# Patient Record
Sex: Male | Born: 1960 | Race: Black or African American | Hispanic: No | Marital: Married | Smoking: Former smoker
Health system: Southern US, Community
[De-identification: ages and names within clinical notes are randomized; demographics above are authoritative.]

## PROBLEM LIST (undated history)

## (undated) DIAGNOSIS — Z21 Asymptomatic human immunodeficiency virus [HIV] infection status: Secondary | ICD-10-CM

## (undated) DIAGNOSIS — B20 Human immunodeficiency virus [HIV] disease: Secondary | ICD-10-CM

## (undated) DIAGNOSIS — I10 Essential (primary) hypertension: Secondary | ICD-10-CM

---

## 2015-04-12 ENCOUNTER — Encounter (HOSPITAL_COMMUNITY): Payer: Self-pay | Admitting: Emergency Medicine

## 2015-04-12 ENCOUNTER — Inpatient Hospital Stay (HOSPITAL_COMMUNITY)
Admission: EM | Admit: 2015-04-12 | Discharge: 2015-04-14 | DRG: 682 | Disposition: A | Discharge status: Home / Self Care | Payer: Medicaid Other | Attending: Family Medicine | Admitting: Family Medicine

## 2015-04-12 ENCOUNTER — Emergency Department (HOSPITAL_COMMUNITY): Payer: Medicaid Other

## 2015-04-12 DIAGNOSIS — R918 Other nonspecific abnormal finding of lung field: Secondary | ICD-10-CM | POA: Diagnosis present

## 2015-04-12 DIAGNOSIS — Z8249 Family history of ischemic heart disease and other diseases of the circulatory system: Secondary | ICD-10-CM | POA: Diagnosis not present

## 2015-04-12 DIAGNOSIS — E876 Hypokalemia: Secondary | ICD-10-CM | POA: Diagnosis present

## 2015-04-12 DIAGNOSIS — Z9119 Patient's noncompliance with other medical treatment and regimen: Secondary | ICD-10-CM | POA: Diagnosis present

## 2015-04-12 DIAGNOSIS — Z21 Asymptomatic human immunodeficiency virus [HIV] infection status: Secondary | ICD-10-CM | POA: Diagnosis not present

## 2015-04-12 DIAGNOSIS — A084 Viral intestinal infection, unspecified: Secondary | ICD-10-CM | POA: Diagnosis present

## 2015-04-12 DIAGNOSIS — E860 Dehydration: Secondary | ICD-10-CM | POA: Diagnosis not present

## 2015-04-12 DIAGNOSIS — B20 Human immunodeficiency virus [HIV] disease: Secondary | ICD-10-CM | POA: Diagnosis present

## 2015-04-12 DIAGNOSIS — N179 Acute kidney failure, unspecified: Principal | ICD-10-CM | POA: Diagnosis present

## 2015-04-12 DIAGNOSIS — I10 Essential (primary) hypertension: Secondary | ICD-10-CM | POA: Diagnosis present

## 2015-04-12 DIAGNOSIS — R1110 Vomiting, unspecified: Secondary | ICD-10-CM

## 2015-04-12 DIAGNOSIS — Z7189 Other specified counseling: Secondary | ICD-10-CM | POA: Diagnosis not present

## 2015-04-12 DIAGNOSIS — R197 Diarrhea, unspecified: Secondary | ICD-10-CM | POA: Diagnosis present

## 2015-04-12 DIAGNOSIS — Z515 Encounter for palliative care: Secondary | ICD-10-CM | POA: Diagnosis not present

## 2015-04-12 DIAGNOSIS — Z66 Do not resuscitate: Secondary | ICD-10-CM | POA: Diagnosis present

## 2015-04-12 DIAGNOSIS — E43 Unspecified severe protein-calorie malnutrition: Secondary | ICD-10-CM | POA: Diagnosis present

## 2015-04-12 DIAGNOSIS — Z87891 Personal history of nicotine dependence: Secondary | ICD-10-CM

## 2015-04-12 DIAGNOSIS — R112 Nausea with vomiting, unspecified: Secondary | ICD-10-CM

## 2015-04-12 DIAGNOSIS — Z681 Body mass index (BMI) 19 or less, adult: Secondary | ICD-10-CM

## 2015-04-12 HISTORY — DX: Essential (primary) hypertension: I10

## 2015-04-12 HISTORY — DX: Asymptomatic human immunodeficiency virus (hiv) infection status: Z21

## 2015-04-12 HISTORY — DX: Human immunodeficiency virus (HIV) disease: B20

## 2015-04-12 LAB — BASIC METABOLIC PANEL
Anion gap: 5 (ref 5–15)
BUN: 24 mg/dL — ABNORMAL HIGH (ref 6–20)
CO2: 22 mmol/L (ref 22–32)
Calcium: 7.9 mg/dL — ABNORMAL LOW (ref 8.9–10.3)
Chloride: 107 mmol/L (ref 101–111)
Creatinine, Ser: 1.82 mg/dL — ABNORMAL HIGH (ref 0.61–1.24)
GFR, EST AFRICAN AMERICAN: 47 mL/min — AB (ref >60)
GFR, EST NON AFRICAN AMERICAN: 41 mL/min — AB (ref >60)
Glucose, Bld: 96 mg/dL (ref 65–99)
POTASSIUM: 2.5 mmol/L — AB (ref 3.5–5.1)
SODIUM: 134 mmol/L — AB (ref 135–145)

## 2015-04-12 LAB — CBC WITH DIFFERENTIAL/PLATELET
BASOS ABS: 0 10*3/uL (ref 0.0–0.1)
Basophils Relative: 0 % (ref 0–1)
Eosinophils Absolute: 0.2 10*3/uL (ref 0.0–0.7)
Eosinophils Relative: 5 % (ref 0–5)
HEMATOCRIT: 40.9 % (ref 39.0–52.0)
HEMOGLOBIN: 14.4 g/dL (ref 13.0–17.0)
LYMPHS PCT: 15 % (ref 12–46)
Lymphs Abs: 0.7 10*3/uL (ref 0.7–4.0)
MCH: 32.7 pg (ref 26.0–34.0)
MCHC: 35.2 g/dL (ref 30.0–36.0)
MCV: 92.7 fL (ref 78.0–100.0)
Monocytes Absolute: 0.6 10*3/uL (ref 0.1–1.0)
Monocytes Relative: 14 % — ABNORMAL HIGH (ref 3–12)
Neutro Abs: 3.1 10*3/uL (ref 1.7–7.7)
Neutrophils Relative %: 66 % (ref 43–77)
PLATELETS: 158 10*3/uL (ref 150–400)
RBC: 4.41 MIL/uL (ref 4.22–5.81)
RDW: 16.8 % — AB (ref 11.5–15.5)
WBC: 4.6 10*3/uL (ref 4.0–10.5)

## 2015-04-12 LAB — COMPREHENSIVE METABOLIC PANEL
ALT: 43 U/L (ref 17–63)
AST: 22 U/L (ref 15–41)
Albumin: 3.7 g/dL (ref 3.5–5.0)
Alkaline Phosphatase: 78 U/L (ref 38–126)
Anion gap: 8 (ref 5–15)
BUN: 27 mg/dL — AB (ref 6–20)
CALCIUM: 8.2 mg/dL — AB (ref 8.9–10.3)
CO2: 24 mmol/L (ref 22–32)
Chloride: 103 mmol/L (ref 101–111)
Creatinine, Ser: 1.85 mg/dL — ABNORMAL HIGH (ref 0.61–1.24)
GFR, EST AFRICAN AMERICAN: 46 mL/min — AB (ref >60)
GFR, EST NON AFRICAN AMERICAN: 40 mL/min — AB (ref >60)
GLUCOSE: 78 mg/dL (ref 65–99)
Potassium: 2.2 mmol/L — CL (ref 3.5–5.1)
SODIUM: 135 mmol/L (ref 135–145)
Total Bilirubin: 0.6 mg/dL (ref 0.3–1.2)
Total Protein: 8.1 g/dL (ref 6.5–8.1)

## 2015-04-12 LAB — URINALYSIS, ROUTINE W REFLEX MICROSCOPIC
BILIRUBIN URINE: NEGATIVE
Glucose, UA: NEGATIVE mg/dL
KETONES UR: NEGATIVE mg/dL
Leukocytes, UA: NEGATIVE
Nitrite: NEGATIVE
Protein, ur: NEGATIVE mg/dL
Specific Gravity, Urine: 1.01 (ref 1.005–1.030)
Urobilinogen, UA: 0.2 mg/dL (ref 0.0–1.0)
pH: 5.5 (ref 5.0–8.0)

## 2015-04-12 LAB — LIPASE, BLOOD: LIPASE: 16 U/L — AB (ref 22–51)

## 2015-04-12 LAB — MAGNESIUM: MAGNESIUM: 1.6 mg/dL — AB (ref 1.7–2.4)

## 2015-04-12 LAB — URINE MICROSCOPIC-ADD ON

## 2015-04-12 LAB — I-STAT CG4 LACTIC ACID, ED: Lactic Acid, Venous: 1.18 mmol/L (ref 0.5–2.0)

## 2015-04-12 MED ORDER — ONDANSETRON HCL 4 MG/2ML IJ SOLN: 4.0000 mg | Freq: Four times a day (QID) | INTRAMUSCULAR | Status: DC | PRN

## 2015-04-12 MED ORDER — POTASSIUM CHLORIDE IN NACL 40-0.9 MEQ/L-% IV SOLN
INTRAVENOUS | Status: DC
  Administered 2015-04-12: 100 mL/h via INTRAVENOUS

## 2015-04-12 MED ORDER — ONDANSETRON HCL 4 MG/2ML IJ SOLN
4.0000 mg | Freq: Once | INTRAMUSCULAR | Status: AC
  Administered 2015-04-12: 4 mg via INTRAVENOUS
  Filled 2015-04-12: qty 2

## 2015-04-12 MED ORDER — DOLUTEGRAVIR SODIUM 50 MG PO TABS
50.0000 mg | ORAL_TABLET | Freq: Every day | ORAL | Status: DC
  Administered 2015-04-13 – 2015-04-14 (×2): 50 mg via ORAL
  Filled 2015-04-12 (×4): qty 1

## 2015-04-12 MED ORDER — AMLODIPINE BESYLATE 5 MG PO TABS
5.0000 mg | ORAL_TABLET | Freq: Every day | ORAL | Status: DC
  Administered 2015-04-13 – 2015-04-14 (×2): 5 mg via ORAL
  Filled 2015-04-12 (×2): qty 1

## 2015-04-12 MED ORDER — ONDANSETRON HCL 4 MG PO TABS: 4.0000 mg | ORAL_TABLET | Freq: Four times a day (QID) | ORAL | Status: DC | PRN

## 2015-04-12 MED ORDER — MAGNESIUM SULFATE 2 GM/50ML IV SOLN
2.0000 g | Freq: Once | INTRAVENOUS | Status: AC
  Administered 2015-04-13: 2 g via INTRAVENOUS
  Filled 2015-04-12: qty 50

## 2015-04-12 MED ORDER — POTASSIUM CHLORIDE 10 MEQ/100ML IV SOLN
10.0000 meq | INTRAVENOUS | Status: DC
  Administered 2015-04-12 (×4): 10 meq via INTRAVENOUS
  Filled 2015-04-12 (×5): qty 100

## 2015-04-12 MED ORDER — PANTOPRAZOLE SODIUM 40 MG IV SOLR
40.0000 mg | Freq: Once | INTRAVENOUS | Status: AC
  Administered 2015-04-12: 40 mg via INTRAVENOUS
  Filled 2015-04-12: qty 40

## 2015-04-12 MED ORDER — RILPIVIRINE HCL 25 MG PO TABS
25.0000 mg | ORAL_TABLET | Freq: Every day | ORAL | Status: DC
  Administered 2015-04-13 – 2015-04-14 (×2): 25 mg via ORAL
  Filled 2015-04-12 (×4): qty 1

## 2015-04-12 MED ORDER — CIPROFLOXACIN IN D5W 400 MG/200ML IV SOLN
400.0000 mg | Freq: Two times a day (BID) | INTRAVENOUS | Status: DC
  Administered 2015-04-13 – 2015-04-14 (×4): 400 mg via INTRAVENOUS
  Filled 2015-04-12 (×4): qty 200

## 2015-04-12 MED ORDER — SODIUM CHLORIDE 0.9 % IJ SOLN
3.0000 mL | Freq: Two times a day (BID) | INTRAMUSCULAR | Status: DC
  Administered 2015-04-12: 3 mL via INTRAVENOUS

## 2015-04-12 MED ORDER — METRONIDAZOLE IN NACL 5-0.79 MG/ML-% IV SOLN: 500.0000 mg | Freq: Three times a day (TID) | INTRAVENOUS | Status: DC

## 2015-04-12 MED ORDER — SODIUM CHLORIDE 0.9 % IV SOLN
Freq: Once | INTRAVENOUS | Status: AC
  Administered 2015-04-12: 17:00:00 via INTRAVENOUS

## 2015-04-12 MED ORDER — SODIUM CHLORIDE 0.9 % IV BOLUS (SEPSIS)
1000.0000 mL | Freq: Once | INTRAVENOUS | Status: AC
  Administered 2015-04-12: 1000 mL via INTRAVENOUS

## 2015-04-12 MED ORDER — ENSURE ENLIVE PO LIQD
237.0000 mL | Freq: Two times a day (BID) | ORAL | Status: DC
Start: 2015-04-13 — End: 2015-04-13
  Administered 2015-04-13 (×2): 237 mL via ORAL

## 2015-04-12 MED ORDER — DARUNAVIR-COBICISTAT 800-150 MG PO TABS
1.0000 | ORAL_TABLET | Freq: Every day | ORAL | Status: DC
  Administered 2015-04-13 – 2015-04-14 (×2): 1 via ORAL
  Filled 2015-04-12 (×4): qty 1

## 2015-04-12 MED ORDER — METRONIDAZOLE IN NACL 5-0.79 MG/ML-% IV SOLN
500.0000 mg | Freq: Three times a day (TID) | INTRAVENOUS | Status: DC
  Administered 2015-04-13 – 2015-04-14 (×5): 500 mg via INTRAVENOUS
  Filled 2015-04-12 (×5): qty 100

## 2015-04-12 NOTE — H&P (Signed)
PCP:   No primary care provider on file.   Chief Complaint:  diarrhea  HPI: 54 yo male h/o hiv/aids, htn comes in with over 2 weeks of diarrhea.  No fevers.  He has not been taking his hiv meds for over 2 months because it makes him sick.  He has had several AIDS related infectious diseases at [facility].  Records are available and have been reviewed.  He understands he has aids, and that he is dying.  He moved here to live with his sister because he is getting sicker.  Came to the ED today because he is very weak.  No pain anywhere.  Asked to admit for his electrolyte abnormalities and aki.  Review of Systems:  Positive and negative as per HPI otherwise all other systems are negative  Past Medical History: Past Medical History  Diagnosis Date  . HIV (human immunodeficiency virus infection)   . Hypertension    History reviewed. No pertinent past surgical history.  Medications: Prior to Admission medications   Medication Sig Start Date End Date Taking? Authorizing Provider  amLODipine (NORVASC) 5 MG tablet Take 5 mg by mouth daily.   Yes Historical Provider, MD  dolutegravir (TIVICAY) 50 MG tablet Take 50 mg by mouth daily.   Yes Historical Provider, MD  rilpivirine (EDURANT) 25 MG TABS tablet Take 25 mg by mouth daily with breakfast.   Yes Historical Provider, MD  PREZCOBIX 800-150 MG per tablet Take 1 tablet by mouth daily. 04/03/15   Historical Provider, MD    Allergies:  No Known Allergies  Social History:  reports that he has quit smoking. He has never used smokeless tobacco. He reports that he does not drink alcohol or use illicit drugs.  Family History: Family History  Problem Relation Age of Onset  . Cancer Father   . Cancer Sister   . Hypertension Sister   . Cancer Other     Physical Exam: Filed Vitals:   04/12/15 2000 04/12/15 2030 04/12/15 2100 04/12/15 2126  BP: 111/89 121/98 115/86 108/76  Pulse: 72 81  65  Temp:    97.8 F (36.6 C)  TempSrc:    Oral   Resp: Height:     (1.753 m)  Weight:    52.708 kg (116 lb 3.2 oz)  SpO2: 100% 99%  100%   General appearance: alert, cooperative and no distress  Cachectic, temporal wasting Head: Normocephalic, without obvious abnormality, atraumatic Eyes: negative Nose: Nares normal. Septum midline. Mucosa normal. No drainage or sinus tenderness. Neck: no JVD and supple, symmetrical, trachea midline Lungs: clear to auscultation bilaterally Heart: regular rate and rhythm, S1, S2 normal, no murmur, click, rub or gallop Abdomen: soft, non-tender; bowel sounds normal; no masses,  no organomegaly Extremities: extremities normal, atraumatic, no cyanosis or edema Pulses: 2+ and symmetric Skin: Skin color, texture, turgor normal. No rashes or lesions Neurologic: Grossly normal    Labs on Admission:   Recent Labs  04/12/15 1725  NA 135  K 2.2*  CL 103  CO2 24  GLUCOSE 78  BUN 27*  CREATININE 1.85*  CALCIUM 8.2*  MG 1.6*    Recent Labs  04/12/15 1725  AST 22  ALT 43  ALKPHOS 78  BILITOT 0.6  PROT 8.1  ALBUMIN 3.7    Recent Labs  04/12/15 1725  LIPASE 16*    Recent Labs  04/12/15 1725  WBC 4.6  NEUTROABS 3.1  HGB 14.4  HCT 40.9  MCV  92.7  PLT 158   Radiological Exams on Admission: Dg Abd Acute W/chest  04/12/2015   CLINICAL DATA:  Four day history of vomiting. History of HIV disease  EXAM: DG ABDOMEN ACUTE W/ 1V CHEST  COMPARISON:  None.  FINDINGS: PA chest: There is no edema or consolidation. There is a 7 mm opacity in the right upper lobe region which is ill-defined and may represent mild scarring. Heart size and pulmonary vascularity are normal. No adenopathy.  Supine and upright abdomen: There is no appreciable bowel dilatation. However, there are multiple air-fluid levels noted. No free air. There are no abnormal calcifications.  IMPRESSION: Scattered air-fluid levels without appreciable bowel dilatation. Suspect early ileus or enteritis as most likely.  Obstruction felt to be less likely. No free air.  No lung edema or consolidation. 7 mm ill-defined opacity right upper lobe of uncertain etiology. Advise followup chest radiograph in 6 to 8 weeks to further evaluate. If this opacity persists, noncontrast enhanced chest CT may be advisable to further assess.   Electronically Signed   By: Bretta BangWilliam  Woodruff III M.D.   On: 04/12/2015 17:48   ekg reviewed u waves present, nsr cxr reviewed no infiltrate or edema Case discussed with edp dr Fayrene Fearingjames  Assessment/Plan  54 yo male with AIDS comes in with diarrhea, aki and electrolyte abdormalites and generalized weakness  Principal Problem:   AKI (acute kidney injury)-  Dehydrated.  2 liters ivf ordered.  Repeat bmp later tonight.  Active Problems:   HIV (human immunodeficiency virus infection)-  Noted, ck cd4 count, off meds for over 2 months.  Does not take any of his prophylactic meds either.   Hypertension- cont norvasc   Hypokalemia-  Replete, repeat bmp later tonight,  Repeat ekg in am, has u waves present.   Diarrhea-  Obtain stool cultures.  Cover with iv cipro and flagyl   Dehydration-  ivf   Nausea & vomiting- only occurred once, none in several hours.  Prn zofran   Lung mass-  Noted   Hypomagnesemia- replete with 2 gm mag sulfate iv once   AIDS-  noted  Noncompliant.  Pt is DNR, does not wish to have cpr or intubation ever in the future.  Has not been set up with ID locally.  Admit to tele floor.    Dyllen Menning A 04/12/2015, 9:45 PM

## 2015-04-12 NOTE — ED Notes (Signed)
Pt reports increasing weakness with emesis x 4 days. Pt has AIDS.

## 2015-04-12 NOTE — ED Notes (Signed)
Pt made aware a urine specimen was needed. Pt verbalized understanding. 

## 2015-04-12 NOTE — ED Notes (Signed)
MD James at bedside updating patient and family. 

## 2015-04-12 NOTE — ED Provider Notes (Signed)
CSN: [id number]     Arrival date & time 04/12/15  1626 History   First MD Initiated Contact with Patient 04/12/15 1635     Chief Complaint  Patient presents with  . Emesis  . Weakness      HPI  Patient presents for evaluation with family. Patient is HIV positive.  6/16:  CD4 count undetectable. Viral load 38,000. This was during an ER visit at [facility]. Also lead diagnosed with candida esophagitis. 11?2015:  [facility] admitted at [facility]. Positive serum Cryptococcal antibodies. However negative spinal tap. Completed fluconazole 2 weeks. Also disseminated zoster. Finished valacyclovir. During that admission, patient's viral load was described as "nearly suppressed". Was positive for normal virus. Negative for C. Difficile.  Patient moved from [place] where he had separated from his wife. He moved to [place] lives with mother, and sister. He's had diarrhea for the last 2 weeks. Was off of his ART for approximately 2-3 weeks before reinitiating one week ago the insistence of his family.  Weakness to the point of "almost collapsing" at home today. Continue diarrhea multiple times per day. Incontinent of stool on the way to the bathroom per family.  Past Medical History  Diagnosis Date  . HIV (human immunodeficiency virus infection)   . Hypertension    History reviewed. No pertinent past surgical history. Family History  Problem Relation Age of Onset  . Cancer Father   . Cancer Sister   . Hypertension Sister   . Cancer Other    History  Substance Use Topics  . Smoking status: Former Games developer  . Smokeless tobacco: Never Used  . Alcohol Use: No    Review of Systems  Constitutional: Negative for fever, chills, diaphoresis, appetite change and fatigue.  HENT: Positive for mouth sores and sore throat. Negative for trouble swallowing.   Eyes: Negative for visual disturbance.  Respiratory: Negative for cough, chest tightness, shortness of breath  and wheezing.   Cardiovascular: Negative for chest pain.  Gastrointestinal: Positive for nausea, vomiting, abdominal pain and diarrhea. Negative for abdominal distention.  Endocrine: Negative for polydipsia, polyphagia and polyuria.  Genitourinary: Negative for dysuria, frequency and hematuria.  Musculoskeletal: Negative for gait problem.  Skin: Negative for color change, pallor and rash.  Neurological: Positive for weakness. Negative for dizziness, syncope, light-headedness and headaches.  Hematological: Does not bruise/bleed easily.  Psychiatric/Behavioral: Negative for behavioral problems and confusion.      Allergies  Review of patient's allergies indicates no known allergies.  Home Medications   Prior to Admission medications   Medication Sig Start Date End Date Taking? Authorizing Provider  amLODipine (NORVASC) 5 MG tablet Take 5 mg by mouth daily.   Yes Historical Provider, MD  dolutegravir (TIVICAY) 50 MG tablet Take 50 mg by mouth daily.   Yes Historical Provider, MD  rilpivirine (EDURANT) 25 MG TABS tablet Take 25 mg by mouth daily with breakfast.   Yes Historical Provider, MD  PREZCOBIX 800-150 MG per tablet Take 1 tablet by mouth daily. 04/03/15   Historical Provider, MD   BP 108/76 mmHg  Pulse 65  Temp(Src) 97.8 F (36.6 C) (Oral)  Resp 15  Ht 5\' 9"  (1.753 m)  Wt 116 lb 3.2 oz (52.708 kg)  BMI 17.15 kg/m2  SpO2 100% Physical Exam  Constitutional: He is oriented to person, place, and time. He has a sickly appearance. No distress.  n Diffuse muscle wasting. Emaciated.  HENT:  Head: Normocephalic.  Toprol muscle wasting. Sunken eyes. Conjunctiva not pale.  Mucous membranes of the mouth are slightly dry.  Eyes: Conjunctivae are normal. Pupils are equal, round, and reactive to light. No scleral icterus.  Neck: Normal range of motion. Neck supple. No thyromegaly present.  No JVD  Cardiovascular: Normal rate and regular rhythm.  Exam reveals no gallop and no friction  rub.   No murmur heard. Normal cardiac exam. Not tachycardic. BP 110 systolic.  Pulmonary/Chest: Effort normal and breath sounds normal. No respiratory distress. He has no wheezes. He has no rales.  Clear bilateral breath sounds.  Abdominal: Soft. Bowel sounds are normal. He exhibits no distension. There is no tenderness. There is no rebound.  Soft benign abdomen. Normal active bowel sounds.  Musculoskeletal: Normal range of motion.  Neurological: He is alert and oriented to person, place, and time.  Skin: Skin is warm and dry. No rash noted.  Dry skin. No rash.  Psychiatric: He has a normal mood and affect. His behavior is normal.    ED Course  Procedures (including critical care time) Labs Review Labs Reviewed  CBC WITH DIFFERENTIAL/PLATELET - Abnormal; Notable for the following:    RDW 16.8 (*)    Monocytes Relative 14 (*)    All other components within normal limits  COMPREHENSIVE METABOLIC PANEL - Abnormal; Notable for the following:    Potassium 2.2 (*)    BUN 27 (*)    Creatinine, Ser 1.85 (*)    Calcium 8.2 (*)    GFR calc non Af Amer 40 (*)    GFR calc Af Amer 46 (*)    All other components within normal limits  LIPASE, BLOOD - Abnormal; Notable for the following:    Lipase 16 (*)    All other components within normal limits  URINALYSIS, ROUTINE W REFLEX MICROSCOPIC (NOT AT [facility]) - Abnormal; Notable for the following:    Hgb urine dipstick TRACE (*)    All other components within normal limits  MAGNESIUM - Abnormal; Notable for the following:    Magnesium 1.6 (*)    All other components within normal limits  BASIC METABOLIC PANEL - Abnormal; Notable for the following:    Sodium 134 (*)    Potassium 2.5 (*)    BUN 24 (*)    Creatinine, Ser 1.82 (*)    Calcium 7.9 (*)    GFR calc non Af Amer 41 (*)    GFR calc Af Amer 47 (*)    All other components within normal limits  CLOSTRIDIUM DIFFICILE BY PCR (NOT AT [facility])  STOOL CULTURE  OVA AND PARASITE EXAMINATION   URINE MICROSCOPIC-ADD ON  CLOSTRIDIUM DIFFICILE CULTURE-FECAL  BASIC METABOLIC PANEL  CBC  FECAL LACTOFERRIN  T-HELPER CELLS (CD4) COUNT  I-STAT CG4 LACTIC ACID, ED    Imaging Review Dg Abd Acute W/chest  04/12/2015   CLINICAL DATA:  Four day history of vomiting. History of HIV disease  EXAM: DG ABDOMEN ACUTE W/ 1V CHEST  COMPARISON:  None.  FINDINGS: PA chest: There is no edema or consolidation. There is a 7 mm opacity in the right upper lobe region which is ill-defined and may represent mild scarring. Heart size and pulmonary vascularity are normal. No adenopathy.  Supine and upright abdomen: There is no appreciable bowel dilatation. However, there are multiple air-fluid levels noted. No free air. There are no abnormal calcifications.  IMPRESSION: Scattered air-fluid levels without appreciable bowel dilatation. Suspect early ileus or enteritis as most likely. Obstruction felt to be less likely. No free air.  No lung edema  or consolidation. 7 mm ill-defined opacity right upper lobe of uncertain etiology. Advise followup chest radiograph in 6 to 8 weeks to further evaluate. If this opacity persists, noncontrast enhanced chest CT may be advisable to further assess.   Electronically Signed   By: Bretta Bang III M.D.   On: 04/12/2015 17:48     EKG Interpretation None      MDM   Final diagnoses:  Vomiting  Acute kidney injury  Hypokalemia  Diarrhea  Dehydration    Patient with hypokalemia at 2.2. EKG with U waves. Discussed with Dr. Onalee Hua. Patient will be admitted.    Rolland Porter, MD 04/13/15 [phone]

## 2015-04-12 NOTE — ED Notes (Signed)
MD James at bedside.  

## 2015-04-12 NOTE — ED Notes (Signed)
CRITICAL VALUE ALERT  Critical value received: potassium  2.2  Date of notification:  04/12/2015  Time of notification:  1825  Critical value read back:Yes.    Nurse who received alert:  Berdine DanceMandi Jaskirat Schwieger RN  MD notified (1st page):  Dr Fayrene FearingJames  Time of first page:  1826  MD notified (2nd page):  Time of second page:  Responding MD:  Dr Fayrene FearingJames  Time MD responded:  [phone]

## 2015-04-13 ENCOUNTER — Encounter (HOSPITAL_COMMUNITY): Payer: Self-pay | Admitting: Primary Care

## 2015-04-13 DIAGNOSIS — Z7189 Other specified counseling: Secondary | ICD-10-CM

## 2015-04-13 DIAGNOSIS — B20 Human immunodeficiency virus [HIV] disease: Secondary | ICD-10-CM

## 2015-04-13 DIAGNOSIS — Z515 Encounter for palliative care: Secondary | ICD-10-CM

## 2015-04-13 DIAGNOSIS — E43 Unspecified severe protein-calorie malnutrition: Secondary | ICD-10-CM

## 2015-04-13 DIAGNOSIS — R197 Diarrhea, unspecified: Secondary | ICD-10-CM

## 2015-04-13 DIAGNOSIS — E876 Hypokalemia: Secondary | ICD-10-CM

## 2015-04-13 DIAGNOSIS — N179 Acute kidney failure, unspecified: Principal | ICD-10-CM

## 2015-04-13 DIAGNOSIS — E860 Dehydration: Secondary | ICD-10-CM

## 2015-04-13 LAB — MAGNESIUM: Magnesium: 2.1 mg/dL (ref 1.7–2.4)

## 2015-04-13 LAB — CBC
HEMATOCRIT: 36.9 % — AB (ref 39.0–52.0)
HEMOGLOBIN: 12.5 g/dL — AB (ref 13.0–17.0)
MCH: 31.4 pg (ref 26.0–34.0)
MCHC: 33.9 g/dL (ref 30.0–36.0)
MCV: 92.7 fL (ref 78.0–100.0)
Platelets: 156 10*3/uL (ref 150–400)
RBC: 3.98 MIL/uL — AB (ref 4.22–5.81)
RDW: 16.8 % — ABNORMAL HIGH (ref 11.5–15.5)
WBC: 3.7 10*3/uL — AB (ref 4.0–10.5)

## 2015-04-13 LAB — BASIC METABOLIC PANEL
Anion gap: 8 (ref 5–15)
BUN: 21 mg/dL — ABNORMAL HIGH (ref 6–20)
CHLORIDE: 106 mmol/L (ref 101–111)
CO2: 22 mmol/L (ref 22–32)
CREATININE: 1.65 mg/dL — AB (ref 0.61–1.24)
Calcium: 7.9 mg/dL — ABNORMAL LOW (ref 8.9–10.3)
GFR calc non Af Amer: 46 mL/min — ABNORMAL LOW (ref >60)
GFR, EST AFRICAN AMERICAN: 53 mL/min — AB (ref >60)
Glucose, Bld: 82 mg/dL (ref 65–99)
Potassium: 2.5 mmol/L — CL (ref 3.5–5.1)
Sodium: 136 mmol/L (ref 135–145)

## 2015-04-13 MED ORDER — POTASSIUM CHLORIDE CRYS ER 20 MEQ PO TBCR
40.0000 meq | EXTENDED_RELEASE_TABLET | ORAL | Status: AC
  Administered 2015-04-13 (×2): 40 meq via ORAL
  Filled 2015-04-13 (×2): qty 2

## 2015-04-13 MED ORDER — BOOST / RESOURCE BREEZE PO LIQD
1.0000 | Freq: Two times a day (BID) | ORAL | Status: DC
  Administered 2015-04-13 – 2015-04-14 (×2): 1 via ORAL

## 2015-04-13 MED ORDER — SODIUM CHLORIDE 0.9 % IV SOLN
INTRAVENOUS | Status: DC
  Administered 2015-04-13: 08:00:00 via INTRAVENOUS

## 2015-04-13 NOTE — Progress Notes (Signed)
CRITICAL VALUE ALERT  Critical value received:  K+ 2.5  Date of notification:  04/13/2015  Time of notification:  0730  Critical value read back: yes  Nurse who received alert:  Kriste BasqueBecky RN  MD notified (1st page):  Irene LimboGoodrich  Time of first page: 0735  MD notified (2nd page):  Time of second page:  Responding MD: Irene LimboGoodrich  Time MD responded:  [phone]

## 2015-04-13 NOTE — Care Management Note (Signed)
Case Management Note  Patient Details  Name: Eldridge ScotGeorge Rueth MRN: [id number] Date of Birth: October 15, 1960  Subjective/Objective:                  Pt admitted from home with AKi. Pt is currently living with family and will return home at discharge. Pt has been independent with ADL's. Pt stated that he was going to continue to follow up with his ID MD at [facility] at discharge.  Action/Plan: No Cm needs noted at this time.  Expected Discharge Date:  04/15/15               Expected Discharge Plan:  Home/Self Care  In-House Referral:  NA  Discharge planning Services  CM Consult  Post Acute Care Choice:  NA Choice offered to:  NA  DME Arranged:    DME Agency:     HH Arranged:    HH Agency:     Status of Service:  Completed, signed off  Medicare Important Message Given:    Date Medicare IM Given:    Medicare IM give by:    Date Additional Medicare IM Given:    Additional Medicare Important Message give by:     If discussed at Long Length of Stay Meetings, dates discussed:    Additional Comments:  Cheryl FlashBlackwell, Quashon Jesus Crowder, RN 04/13/2015, 3:56 PM

## 2015-04-13 NOTE — Progress Notes (Signed)
[facility] PROGRESS NOTE  Kenneth Duran WUJ:[id number] DOB: 06-24-1961 DOA: 04/12/2015 PCP: No primary care provider on file.  Assessment/Plan: AKI (acute kidney injury)-likely related to dehydration form decreased oral intake and GI losses in diarrhea. Improving with IV fluids. Less stools during night. Reports improved appetitie. Continue IV fluids at current rate. Urine output fair. Continue to monitor  Active Problems:  Hypokalemia- little improvement after 5 runs. Magnesium level within limits of normal. No events on tele. Repeat EKG with NSR.  Will replete orally as nausea improved. Recheck. Monitor loose stool.    Diarrhea- less frequent since admission. Need to send  stool for studies. Continue  iv cipro and flagyl day #2. He is afebrile and non-toxic appearing.    Dehydration- related to decreased oral intake, diarrhea in setting or AIDS. Improving with IV fluids. Will continue IV fluids at current rate. Recheck in am.   Nausea & vomiting- abdominal xray with possible early ileus. Exam benign.  Much improved this am. Tolerated breakfast. Prn zofran   HIV (human immunodeficiency virus infection)- Await  cd4 count. Reports not seeing MD for 3 months. Moved to area so sister could care for him ([place] estranged [place] Reports he knows he is dying. Some anxiety about "when". Discussed end of life care and Palliative team. Patient open to Palliative care consult. Will request. Of note, spoke to estranged wife on phone who confirms patients wishes regarding end of life and not wishing to seek aggressive therapy.    Hypertension- controlled. Will continue home norvasc   Lung mass- abdominal xray with 7mm ill-defined opacity right upper lobe uncertain etiology. Recommending repeat xray 6 weeks.    Hypomagnesemia- repleted and magnesium 2.1 today.    AIDS- see #6. Await Palliative care recommendations. May benefit hospice home.   Code Status: DNR Family  Communication: wife on phone (estranged) Disposition Plan: to be determined   Consultants:  Palliative care  Procedures:  none  Antibiotics:  cipro 04/12/15>>  Flagyl 04/12/15>>  HPI/Subjective: Lying in bed. Reports less nausea. No pain. Reports one loose stoole  Objective: Filed Vitals:   04/13/15 0528  BP: 124/84  Pulse: 67  Temp: 97.6 F (36.4 C)  Resp: 15    Intake/Output Summary (Last 24 hours) at 04/13/15 1140 Last data filed at 04/13/15 1053  Gross per 24 hour  Intake   4620 ml  Output   1580 ml  Net   3040 ml   Filed Weights   04/12/15 1637 04/12/15 2126 04/13/15 0528  Weight: 51.256 kg (113 lb) 52.708 kg (116 lb 3.2 oz) 50.44 kg (111 lb 3.2 oz)    Exam:   General:  Thin frail appears ill but not toxic  Cardiovascular: RRR no MGR No LE edema  Respiratory: normal effort somewhat shallow BS clear bilaterally no wheeze  Abdomen: non-distended +BS but sluggish non-tender  Musculoskeletal: no clubbing or cyanosis   Data Reviewed: Basic Metabolic Panel:  Recent Labs Lab 04/12/15 1725 04/12/15 2239 04/13/15 0530 04/13/15 0618  NA 135 134*  --  136  K 2.2* 2.5*  --  2.5*  CL 103 107  --  106  CO2 24 22  --  22  GLUCOSE 78 96  --  82  BUN 27* 24*  --  21*  CREATININE 1.85* 1.82*  --  1.65*  CALCIUM 8.2* 7.9*  --  7.9*  MG 1.6*  --  2.1  --    Liver Function Tests:  Recent Labs Lab 04/12/15 1725  AST 22  ALT 43  ALKPHOS 78  BILITOT 0.6  PROT 8.1  ALBUMIN 3.7    Recent Labs Lab 04/12/15 1725  LIPASE 16*   No results for input(s): AMMONIA in the last 168 hours. CBC:  Recent Labs Lab 04/12/15 1725 04/13/15 0618  WBC 4.6 3.7*  NEUTROABS 3.1  --   HGB 14.4 12.5*  HCT 40.9 36.9*  MCV 92.7 92.7  PLT 158 156   Cardiac Enzymes: No results for input(s): CKTOTAL, CKMB, CKMBINDEX, TROPONINI in the last 168 hours. BNP (last 3 results) No results for input(s): BNP in the last 8760 hours.  ProBNP (last 3 results) No results  for input(s): PROBNP in the last 8760 hours.  CBG: No results for input(s): GLUCAP in the last 168 hours.  No results found for this or any previous visit (from the past 240 hour(s)).   Studies: Dg Abd Acute W/chest  04/12/2015   CLINICAL DATA:  Four day history of vomiting. History of HIV disease  EXAM: DG ABDOMEN ACUTE W/ 1V CHEST  COMPARISON:  None.  FINDINGS: PA chest: There is no edema or consolidation. There is a 7 mm opacity in the right upper lobe region which is ill-defined and may represent mild scarring. Heart size and pulmonary vascularity are normal. No adenopathy.  Supine and upright abdomen: There is no appreciable bowel dilatation. However, there are multiple air-fluid levels noted. No free air. There are no abnormal calcifications.  IMPRESSION: Scattered air-fluid levels without appreciable bowel dilatation. Suspect early ileus or enteritis as most likely. Obstruction felt to be less likely. No free air.  No lung edema or consolidation. 7 mm ill-defined opacity right upper lobe of uncertain etiology. Advise followup chest radiograph in 6 to 8 weeks to further evaluate. If this opacity persists, noncontrast enhanced chest CT may be advisable to further assess.   Electronically Signed   By: Bretta Bang III M.D.   On: 04/12/2015 17:48    Scheduled Meds: . amLODipine  5 mg Oral Daily  . ciprofloxacin  400 mg Intravenous Q12H  . darunavir-cobicistat  1 tablet Oral QPC breakfast  . dolutegravir  50 mg Oral Daily  . feeding supplement (ENSURE ENLIVE)  237 mL Oral BID BM  . metronidazole  500 mg Intravenous Q8H  . rilpivirine  25 mg Oral Q breakfast  . sodium chloride  3 mL Intravenous Q12H   Continuous Infusions: . sodium chloride 125 mL/hr at 04/13/15 1610    Principal Problem:   AKI (acute kidney injury) Active Problems:   HIV (human immunodeficiency virus infection)   Hypertension   Hypokalemia   Diarrhea   Dehydration   Nausea & vomiting   Lung mass    Hypomagnesemia   AIDS    Time spent: 35 mintues    [facility] M  [facility] Pager [phone]. If 7PM-7AM, please contact night-coverage at [email or web address], password [facility] 04/13/2015, 11:40 AM  LOS: 1 day

## 2015-04-13 NOTE — Consult Note (Signed)
Consultation Note Date: 04/13/2015   Patient Name: Kenneth Duran  DOB: [birth year]  MRN: [id number]  Age / Sex: 54 y.o., male   PCP: No primary care provider on file. Referring Physician: Standley Brooking, MD  Reason for Consultation: Establishing goals of care and Psychosocial/spiritual support  Palliative Care Assessment and Plan Summary of Established Goals of Care and Medical Treatment Preferences   Clinical Assessment/Narrative: Kenneth Duran is frail and ill appearing, stting up in bed talking with chaplain as I enter. When asked about what the doctors have told him about his illness, he only talks about his weakness and getting fluids.  We further explore and he tells me he was found to be HIV+ in jail only 3 years prior.  He talks about going to kill the woman he believes gave him HIV, but that she was already dead.   We talk about some of the chronic illnesses that are associated with HIV/AIDS.    Kenneth Duran states he has his own apartment in [place] apart from his wife, but that he moved in with his sister in [place] 3 weeks ago.  He states he had stopped taking his HIV meds for about 2 months d/t nausea, but restarted for the last 3 weeks while with his sister.  We talk about his IADL and ADL's, and he tells me that he gets food stamps, and is able to heat food, he bathes every other day, but talks about difficulty washing clothes.  He looks poorly groomed and is odorous.   He states his sister tells him to not get items from the refrigerator, she will get for him, stating her concern for her children/grandchildren.  Kenneth Duran states his goal now is to get his own apartment in [place], so he doesn't "worry about them getting sick from me".   He tells me his wife is also HIV+, but that she has good health.    We discuss his frailty/weakness for the last month and probable need for help in the future.  We discuss Hospice and he seems to focus that if they cant get an apartment for him, then  they are no help.   When asked where he sees himself in 3 months, 6 months, he states "I don't know".  We talk about end of life, and what this will look like for him, but he talks about his burial plot.  I ask about the time before he passes, what he would want this to look like, and he states that he has never thought of this.     He talks about having support form his 3 sons in [place], and 2 daughters in [place], and talks about his 18 siblings.  We talk about his choices on where he will live when he leaves the hospital, and he talks about his apartment in [place] and all he has to do is call his son to take him there. He is unable to state where he will live at this time.  We talk about his decision to take his HIV medications and the importance of continuing, but that if/when he decides to stop the we can change focus of care.   Kenneth Duran is irritable nearing the end of our discussion.   Kenneth Duran is unwilling to continue talking when he realized that I wont be able to get him an apartment.  He states he doesn't want to say anything that will hurt our relationship, but that he can 'get loud', and they  will call the police on him and take him somewhere.  He expresses frustration at having to tell his story multiple times.  He states "I'm a very grumpy person".    He does smile and thank me when I bring him apple juice.   Contacts/Participants in Discussion: Primary Decision Maker: Kenneth Duran makes his own decisions.    HCPOA: no  We talk about HCPOA, and he states he wants his wife to make his healthcare decisions. "she knows what she wants".  We talk about her following his wishes, and he states "it wont matter, I'll be dead".  Kenneth Duran would benefit from further exploration of HCPOA as he is able.   Code Status/Advance Care Planning:  DNR  We discussed continued choice to be DNR, at one point he states "whatever ya'll want", and I explain that this is his choice.   Symptom Management:     N/V Zofram 4 mg PO/IV Q 6 hours PRN.   Palliative Prophylaxis: diarrhea currently, no need for stool softener/laxative.   Psycho-social/Spiritual:   Support System: He has been living with his sister, Kenneth Duran, for the last 3 weeks.    His wife lives in [place], but they were not living together. He talks about having support form his 3 sons in [place], and 2 daughters in [place], and talks about his 18 siblings.  Desire for further Chaplaincy support:  Ongoing   Prognosis: Unable to determine  Discharge Planning:  Home with Home Health, encouraged home with Hospice, but Kenneth Duran, only wants someone to get him an apartment.        Chief Complaint: Diarrhea History of Present Illness: 54 yo male h/o hiv/aids, htn comes in with over 2 weeks of diarrhea. No fevers. He has not been taking his hiv meds for over 2 months because it makes him sick. He has had several AIDS related infectious diseases at [facility]. Records are available and have been reviewed. He understands he has aids, and that he is dying. He moved here to live with his sister because he is getting sicker. Came to the ED today because he is very weak. No pain anywhere. Asked to admit for his electrolyte abnormalities and aki.  Primary Diagnoses  Present on Admission:  . AKI (acute kidney injury) . Hypokalemia . Diarrhea . Dehydration . Hypertension . HIV (human immunodeficiency virus infection) . Nausea & vomiting . Lung mass . Hypomagnesemia . AIDS  Palliative Review of Systems: Denies pain, dyspnea, anxiety or n/v at this time.   I have reviewed the medical record, interviewed the patient and family, and examined the patient. The following aspects are pertinent.  Past Medical History  Diagnosis Date  . HIV (human immunodeficiency virus infection)   . Hypertension    History   Social History  . Marital Status: Married    Spouse Name: N/A  . Number of Children: N/A  . Years of Education: N/A    Social History Main Topics  . Smoking status: Former Games developer  . Smokeless tobacco: Never Used  . Alcohol Use: No  . Drug Use: No  . Sexual Activity: Not on file   Other Topics Concern  . None   Social History Narrative  . None   Family History  Problem Relation Age of Onset  . Cancer Father   . Cancer Sister   . Hypertension Sister   . Cancer Other    Scheduled Meds: . amLODipine  5 mg Oral Daily  . ciprofloxacin  400 mg Intravenous Q12H  . darunavir-cobicistat  1 tablet Oral QPC breakfast  . dolutegravir  50 mg Oral Daily  . feeding supplement (ENSURE ENLIVE)  237 mL Oral BID BM  . metronidazole  500 mg Intravenous Q8H  . rilpivirine  25 mg Oral Q breakfast  . sodium chloride  3 mL Intravenous Q12H   Continuous Infusions: . sodium chloride 125 mL/hr at 04/13/15 0824   PRN Meds:.ondansetron **OR** ondansetron (ZOFRAN) IV Medications Prior to Admission:  Prior to Admission medications   Medication Sig Start Date End Date Taking? Authorizing Provider  amLODipine (NORVASC) 5 MG tablet Take 5 mg by mouth daily.   Yes Historical Provider, MD  dolutegravir (TIVICAY) 50 MG tablet Take 50 mg by mouth daily.   Yes Historical Provider, MD  rilpivirine (EDURANT) 25 MG TABS tablet Take 25 mg by mouth daily with breakfast.   Yes Historical Provider, MD  PREZCOBIX 800-150 MG per tablet Take 1 tablet by mouth daily. 04/03/15   Historical Provider, MD   No Known Allergies CBC:    Component Value Date/Time   WBC 3.7* 04/13/2015 0618   HGB 12.5* 04/13/2015 0618   HCT 36.9* 04/13/2015 0618   PLT 156 04/13/2015 0618   MCV 92.7 04/13/2015 0618   NEUTROABS 3.1 04/12/2015 1725   LYMPHSABS 0.7 04/12/2015 1725   MONOABS 0.6 04/12/2015 1725   EOSABS 0.2 04/12/2015 1725   BASOSABS 0.0 04/12/2015 1725   Comprehensive Metabolic Panel:    Component Value Date/Time   NA 136 04/13/2015 0618   K 2.5* 04/13/2015 0618   CL 106 04/13/2015 0618   CO2 22 04/13/2015 0618   BUN 21*  04/13/2015 0618   CREATININE 1.65* 04/13/2015 0618   GLUCOSE 82 04/13/2015 0618   CALCIUM 7.9* 04/13/2015 0618   AST 22 04/12/2015 1725   ALT 43 04/12/2015 1725   ALKPHOS 78 04/12/2015 1725   BILITOT 0.6 04/12/2015 1725   PROT 8.1 04/12/2015 1725   ALBUMIN 3.7 04/12/2015 1725    Physical Exam: Vital Signs: BP 124/84 mmHg  Pulse 67  Temp(Src) 97.6 F (36.4 C) (Oral)  Resp 15  Ht 5\' 9"  (1.753 m)  Wt 50.44 kg (111 lb 3.2 oz)  BMI 16.41 kg/m2  SpO2 99% SpO2: SpO2: 99 % O2 Device: O2 Device: Not Delivered O2 Flow Rate:   Intake/output summary:  Intake/Output Summary (Last 24 hours) at 04/13/15 1451 Last data filed at 04/13/15 1225  Gross per 24 hour  Intake   4860 ml  Output   1580 ml  Net   3280 ml   LBM: Last BM Date: 04/12/15 Baseline Weight: Weight: 51.256 kg (113 lb)   Kenneth Duran states his weight was 140 lbs 2 months ago.  Most recent weight: Weight: 50.44 kg (111 lb 3.2 oz)  Exam Findings:  Constitutional:  Frail, ill appearing, temporal wasting.  Sitting up in bed, looks poorly groomed and odorous.  Resp:even and non labored. GI:  Abd soft flat, non tender.  Musc:extreme muscle wasting.  Skin: dry, scabs on b/l forearms.           Palliative Performance Scale:            3 months ago: 70% 2 weeks ago: 60% Today: 50%  Kenneth Duran states his weight was 140 lbs 2 months ago, 111 lbs today.    Additional Data Reviewed: Recent Labs     04/12/15  1725  04/12/15  2239  04/13/15  0618  WBC  4.6   --  3.7*  HGB  14.4   --   12.5*  PLT  158   --   156  NA  135  134*  136  BUN  27*  24*  21*  CREATININE  1.85*  1.82*  1.65*     Time In: 1315 Time Out: 1445 Time Total:  90 miutes Greater than 50%  of this time was spent counseling and coordinating care related to the above assessment and plan. Discussion of meeting shared with nursing, CM, and Dr. Irene Limbo.  Thank you for consulting the PMT.   Signed by: Katheran Awe, NP  Katheran Awe, NP  04/13/2015,  2:51 PM  Please contact Palliative Medicine Team phone at [phone] for questions and concerns.

## 2015-04-13 NOTE — Progress Notes (Signed)
Brief initial visit offering emotional and spiritual support. Patient became expressive about life review. Will follow up with him if still here on July 11.

## 2015-04-13 NOTE — Progress Notes (Addendum)
Initial Nutrition Assessment  DOCUMENTATION CODES:  Severe malnutrition in context of chronic illness, Underweight  INTERVENTION:  Ensure Enlive (each supplement provides 350kcal and 20 grams of protein), Snacks (as desired )  RD will continue to follow  NUTRITION DIAGNOSIS:  Increased nutrient needs related to chronic illness as evidenced by estimated needs.  GOAL:  Other (Comment) (Likely unable to meet pt est needs given his terminal illness and healthcare goals)  MONITOR:  PO intake, Supplement acceptance, Labs, Other (Comment) (progression and palliative care goals)  REASON FOR ASSESSMENT:  Malnutrition Screening Tool    ASSESSMENT: Pt is 54 yo male with HIV/AIDS. Reported diarrhea prior to admission (2 weeks) but negative for c. diff. Increased weakness may be related to inadequate oral intake compared to needs and catabolic illness. He did eat well this morning (90%) of breakfast and (40%) at lunch. Able to feed himself. Discussed supplement intake with nursing: he doesn't like the chocolate/vanilla Ensure. Will change to [employer] Breeze.  Nutrition exam: He has severe depletions of muscle and fat mass .  Labs: BUN and Cr. trending down today. Potassium remains low 2.5.  Height:  Ht Readings from Last 1 Encounters:  04/12/15 5\' 9"  (1.753 m)    Weight:  Wt Readings from Last 1 Encounters:  04/13/15 111 lb 3.2 oz (50.44 kg)    Ideal Body Weight:  72.7 kg  Wt Readings from Last 10 Encounters:  04/13/15 111 lb 3.2 oz (50.44 kg)    BMI:  Body mass index is 16.41 kg/(m^2).  Estimated Nutritional Needs:  Kcal:  2000-2250 (40-45 kcal/kg)  Protein:   75-100 gr (1.5-2.0 gr/kg)  Fluid:  >1500 ml daily  Skin:   WDL  Diet Order:  Diet regular Room service appropriate?: Yes; Fluid consistency:: Thin  EDUCATION NEEDS:  No education needs identified at this time   Intake/Output Summary (Last 24 hours) at 04/13/15 1508 Last data filed at 04/13/15  1452  Gross per 24 hour  Intake   4860 ml  Output   1856 ml  Net   3004 ml    Last BM:  7/7  Royann ShiversLynn Josaiah Muhammed MS,RD,CSG,LDN Office: [phone] Pager: [phone]

## 2015-04-14 LAB — BASIC METABOLIC PANEL
ANION GAP: 4 — AB (ref 5–15)
BUN: 16 mg/dL (ref 6–20)
CHLORIDE: 112 mmol/L — AB (ref 101–111)
CO2: 21 mmol/L — AB (ref 22–32)
Calcium: 8.2 mg/dL — ABNORMAL LOW (ref 8.9–10.3)
Creatinine, Ser: 1.48 mg/dL — ABNORMAL HIGH (ref 0.61–1.24)
GFR calc non Af Amer: 52 mL/min — ABNORMAL LOW (ref >60)
Glucose, Bld: 73 mg/dL (ref 65–99)
POTASSIUM: 3.2 mmol/L — AB (ref 3.5–5.1)
Sodium: 137 mmol/L (ref 135–145)

## 2015-04-14 LAB — MAGNESIUM: Magnesium: 1.8 mg/dL (ref 1.7–2.4)

## 2015-04-14 MED ORDER — METRONIDAZOLE 500 MG PO TABS: 500.0000 mg | ORAL_TABLET | Freq: Three times a day (TID) | ORAL | Status: DC

## 2015-04-14 MED ORDER — CIPROFLOXACIN HCL 250 MG PO TABS: 500.0000 mg | ORAL_TABLET | Freq: Two times a day (BID) | ORAL | Status: DC

## 2015-04-14 MED ORDER — MAGNESIUM SULFATE 2 GM/50ML IV SOLN
2.0000 g | Freq: Once | INTRAVENOUS | Status: AC
  Administered 2015-04-14: 2 g via INTRAVENOUS
  Filled 2015-04-14: qty 50

## 2015-04-14 MED ORDER — BOOST / RESOURCE BREEZE PO LIQD: 1.0000 | Freq: Two times a day (BID) | ORAL | Status: AC

## 2015-04-14 MED ORDER — CIPROFLOXACIN HCL 500 MG PO TABS: 500.0000 mg | ORAL_TABLET | Freq: Two times a day (BID) | ORAL | Status: DC

## 2015-04-14 MED ORDER — POTASSIUM CHLORIDE CRYS ER 20 MEQ PO TBCR
40.0000 meq | EXTENDED_RELEASE_TABLET | ORAL | Status: DC
  Administered 2015-04-14: 40 meq via ORAL
  Filled 2015-04-14 (×2): qty 2

## 2015-04-14 NOTE — Progress Notes (Signed)
PROGRESS NOTE  Kenneth Duran ZOX:[id number] DOB: 11/07/1960 DOA: 04/12/2015 PCP: Verneita Griffes, NP ID Marcy Panning  Summary: 54 year old man with history of AIDS, disseminated zoster, systemic cryptococcosis previously has received care at [facility] presented to the emergency department with 2 week history of diarrhea. Recently moved to live with his sister. Admitted for acute kidney injury secondary to dehydration, hypokalemia.  Assessment/Plan: 1. Acute kidney injury, secondary to diarrhea and volume loss. Rapidly improving with IV fluids. Secondary to diarrhea. 2. Diarrhea. Suspect viral process but treated with antibiotics because of immunocompromised state. Suspect viral enteritis. Abdominal imaging suggested enteritis. Resolving rapidly. 3. Marked hypokalemia, secondary to diarrhea. Repleted. 4. Hypomagnesemia. Repleted. 5. AIDS. Appears stable. 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 03/23/2015: HIV, ultraquant 22800 Treated for oral candidiasis 03/2015. Patient plans to continue to follow with his infectious disease physician at [facility] 6. Possible lung mass. 7mm ill-defined opacity right upper lobe uncertain etiology. Recommend repeat xray 6 weeks.  7. Severe malnutrition, underweight. Recommend Ensure. 8. GOC. Per patient: DNR. Plan to continue medical care.   Continues to improve. Tolerating diet. Diarrhea nearly resolved. Potassium level near normal.  Plan further potassium repletion.  Home later today.  Brendia Sacks, MD  [facility]  Pager [phone] If 7PM-7AM, please contact night-coverage at [email or web address], password [facility] 04/14/2015, 9:46 AM  LOS: 2 days   Consultants:  Palliative care  Procedures:  none  Antibiotics:  cipro 04/12/15>> 7/13  Flagyl 04/12/15>> 7/13  HPI/Subjective: Feels much better. Diarrhea has decreased. Tolerating diet. Overall feels much better.  Objective: Filed Vitals:   04/13/15 0528 04/13/15  1453 04/13/15 2141 04/14/15 0616  BP: 124/84 108/80 118/86 121/88  Pulse: 67 72 78 88  Temp: 97.6 F (36.4 C) 98.3 F (36.8 C) 98.2 F (36.8 C) 97.5 F (36.4 C)  TempSrc: Oral Oral Oral Oral  Resp: Height:      Weight: 50.44 kg (111 lb 3.2 oz)   53.434 kg (117 lb 12.8 oz)  SpO2: 99% 100% 100% 100%    Intake/Output Summary (Last 24 hours) at 04/14/15 0946 Last data filed at 04/14/15 0838  Gross per 24 hour  Intake   5315 ml  Output   2127 ml  Net   3188 ml     Filed Weights   04/12/15 2126 04/13/15 0528 04/14/15 0616  Weight: 52.708 kg (116 lb 3.2 oz) 50.44 kg (111 lb 3.2 oz) 53.434 kg (117 lb 12.8 oz)    Exam:     Afebrile, vitals stable, no hypoxia General:  Appears calm and comfortable Cardiovascular: RRR, no m/r/g. No LE edema. Telemetry: SR, no arrhythmias  Respiratory: CTA bilaterally, no w/r/r. Normal respiratory effort. Abdomen: soft, nondistended. Mild generalized tenderness. Psychiatric: grossly normal mood and affect, speech fluent and appropriate  New data reviewed:  Potassium has improved, 3.2  BUN has normalized  Creatinine continues to trend down, 1.48  Pertinent data since admission:    Pending data:  GI pathogen panel  Scheduled Meds: . amLODipine  5 mg Oral Daily  . ciprofloxacin  400 mg Intravenous Q12H  . darunavir-cobicistat  1 tablet Oral QPC breakfast  . dolutegravir  50 mg Oral Daily  . feeding supplement (RESOURCE BREEZE)  1 Container Oral BID BM  . magnesium sulfate 1 - 4 g bolus IVPB  2 g Intravenous Once  . metroNIDAZOLE  500 mg Oral 3 times per day  . potassium chloride  40 mEq Oral Q4H  . rilpivirine  25  mg Oral Q breakfast  . sodium chloride  3 mL Intravenous Q12H   Continuous Infusions:   Principal Problem:   AKI (acute kidney injury) Active Problems:   HIV (human immunodeficiency virus infection)   Hypertension   Hypokalemia   Diarrhea   Dehydration   Nausea & vomiting   Lung mass    Hypomagnesemia   AIDS   Protein-calorie malnutrition, severe   Palliative care encounter   DNR (do not resuscitate) discussion

## 2015-04-14 NOTE — Discharge Summary (Signed)
Physician Discharge Summary  Kenneth Duran ZOX:[id number] DOB: [birth year] DOA: 04/12/2015  PCP: Verneita GriffesHAIRSTON, TAWANNA LATRICE, NP  Admit date: 04/12/2015 Discharge date: 04/14/2015  Recommendations for Outpatient Follow-up:  1. Ongoing care for AIDS 2. Resolution of diarrhea 3. Possible lung mass. 7mm ill-defined opacity right upper lobe uncertain etiology. Recommend repeat xray 6 weeks.  4. Severe malnutrition, underweight.  5. Goals of care. Recommend ongoing outpatient palliative medicine care if available.   Follow-up Information    [place] Schedule an appointment as soon as possible for a visit in 2 weeks.     Discharge Diagnoses:  1. Acute kidney injury 2. Diarrhea 3. Marked hypokalemia 4. Hypomagnesemia 5. AIDS 6. Possible lung mass 7. Severe malnutrition, underweight  Discharge Condition: improved Disposition: home  Diet recommendation: regular, recommend Ensure  [facility]   04/12/15 2126 04/13/15 0528 04/14/15 0616  Weight: 52.708 kg (116 lb 3.2 oz) 50.44 kg (111 lb 3.2 oz) 53.434 kg (117 lb 12.8 oz)    History of present illness:  54 year old man with history of AIDS, disseminated zoster, systemic cryptococcosis [facility] presented to the emergency department with 2 week history of diarrhea. Recently moved to live with his sister. Admitted for acute kidney injury secondary to dehydration, hypokalemia.  Hospital Course:  Mr. Andrey CampanileWilson was treated with supportive care, IV fluids, antibiotics with rapid resolution of significant diarrhea. Tolerating diet. Potassium was replaced as was magnesium. Given rapid improvement in symptoms, favor viral enteritis. He was seen by palliative medicine. Goals of care at this time are DNR, continue medications and medical treatment. No signs or symptoms to suggest obstruction. Individual issues as below.  1. Acute kidney injury, secondary to diarrhea and  volume loss. Rapidly improving with IV fluids. Secondary to diarrhea. 2. Diarrhea. Suspect viral process but treated with antibiotics because of immunocompromised state. Suspect viral enteritis. Abdominal imaging suggested enteritis. Resolving rapidly. 3. Marked hypokalemia, secondary to diarrhea. Repleted. 4. Hypomagnesemia. Repleted. 5. AIDS. Appears stable. 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 03/23/2015: HIV, ultraquant 22800 Treated for oral candidiasis 03/2015. Patient plans to continue to follow [facility] 6. Possible lung mass. 7mm ill-defined opacity right upper lobe uncertain etiology. Recommend repeat xray 6 weeks.  7. Severe malnutrition, underweight. Recommend Ensure. 8. GOC. Per patient: DNR. Plan to continue medical care.  Consultants:  Palliative care  Procedures:  none  Antibiotics:  cipro 04/12/15>> 7/13  Flagyl 04/12/15>> 7/13  Discharge Instructions  Discharge Instructions    Activity as tolerated - No restrictions    Complete by:  As directed      Diet general    Complete by:  As directed      Discharge instructions    Complete by:  As directed   Call your physician or seek immediate medical attention for fever, diarrhea, pain or worsening of condition.          Current Discharge Medication List    START taking these medications   Details  ciprofloxacin (CIPRO) 500 MG tablet Take 1 tablet (500 mg total) by mouth 2 (two) times daily. Qty: 9 tablet, Refills: 0    feeding supplement, RESOURCE BREEZE, (RESOURCE BREEZE) LIQD Take 1 Container by mouth 2 (two) times daily between meals.    metroNIDAZOLE (FLAGYL) 500 MG tablet Take 1 tablet (500 mg total) by mouth every 8 (eight) hours. Qty: 13 tablet, Refills: 0      CONTINUE these medications which  have NOT CHANGED   Details  amLODipine (NORVASC) 5 MG tablet Take 5 mg by mouth daily.    dolutegravir (TIVICAY) 50 MG tablet Take 50 mg by  mouth daily.    rilpivirine (EDURANT) 25 MG TABS tablet Take 25 mg by mouth daily with breakfast.    PREZCOBIX 800-150 MG per tablet Take 1 tablet by mouth daily. Refills: 0       No Known Allergies  The results of significant diagnostics from this hospitalization (including imaging, microbiology, ancillary and laboratory) are listed below for reference.    Significant Diagnostic Studies: Dg Abd Acute W/chest  04/12/2015   CLINICAL DATA:  Four day history of vomiting. History of HIV disease  EXAM: DG ABDOMEN ACUTE W/ 1V CHEST  COMPARISON:  None.  FINDINGS: PA chest: There is no edema or consolidation. There is a 7 mm opacity in the right upper lobe region which is ill-defined and may represent mild scarring. Heart size and pulmonary vascularity are normal. No adenopathy.  Supine and upright abdomen: There is no appreciable bowel dilatation. However, there are multiple air-fluid levels noted. No free air. There are no abnormal calcifications.  IMPRESSION: Scattered air-fluid levels without appreciable bowel dilatation. Suspect early ileus or enteritis as most likely. Obstruction felt to be less likely. No free air.  No lung edema or consolidation. 7 mm ill-defined opacity right upper lobe of uncertain etiology. Advise followup chest radiograph in 6 to 8 weeks to further evaluate. If this opacity persists, noncontrast enhanced chest CT may be advisable to further assess.   Electronically Signed   By: Bretta Bang III M.D.   On: 04/12/2015 17:48    Labs: Basic Metabolic Panel:  Recent Labs Lab 04/12/15 1725 04/12/15 2239 04/13/15 0530 04/13/15 0618 04/14/15 0640  NA 135 134*  --  136 137  K 2.2* 2.5*  --  2.5* 3.2*  CL 103 107  --  106 112*  CO2 24 22  --  22 21*  GLUCOSE 78 96  --  82 73  BUN 27* 24*  --  21* 16  CREATININE 1.85* 1.82*  --  1.65* 1.48*  CALCIUM 8.2* 7.9*  --  7.9* 8.2*  MG 1.6*  --  2.1  --  1.8   Liver Function Tests:  Recent Labs Lab 04/12/15 1725  AST  22  ALT 43  ALKPHOS 78  BILITOT 0.6  PROT 8.1  ALBUMIN 3.7    Recent Labs Lab 04/12/15 1725  LIPASE 16*   CBC:  Recent Labs Lab 04/12/15 1725 04/13/15 0618  WBC 4.6 3.7*  NEUTROABS 3.1  --   HGB 14.4 12.5*  HCT 40.9 36.9*  MCV 92.7 92.7  PLT 158 156      Principal Problem:   AKI (acute kidney injury) Active Problems:   HIV (human immunodeficiency virus infection)   Hypertension   Hypokalemia   Diarrhea   Dehydration   Nausea & vomiting   Lung mass   Hypomagnesemia   AIDS   Protein-calorie malnutrition, severe   Palliative care encounter   DNR (do not resuscitate) discussion   Time coordinating discharge: 35 minutes  Signed:  Brendia Sacks, MD [facility] 04/14/2015, 10:58 AM

## 2015-04-14 NOTE — Progress Notes (Signed)
Pt's IV catheter removed and intact. Pt's IV site clean dry and intact. Discharge instructions, medications and follow up appointments reviewed and discussed with patient. All questions were answered and no further questions at this time. Pt in stable condition at time of discharge and in no acute distress. Pt escorted by nurse.

## 2015-04-17 LAB — GI PATHOGEN PANEL BY PCR, STOOL
C difficile toxin A/B: NOT DETECTED
Campylobacter by PCR: NOT DETECTED
Cryptosporidium by PCR: NOT DETECTED
E COLI (ETEC) LT/ST: NOT DETECTED
E coli (STEC): NOT DETECTED
E coli 0157 by PCR: NOT DETECTED
G LAMBLIA BY PCR: NOT DETECTED
Rotavirus A by PCR: NOT DETECTED
SHIGELLA BY PCR: NOT DETECTED
Salmonella by PCR: NOT DETECTED

## 2015-05-04 ENCOUNTER — Emergency Department (HOSPITAL_COMMUNITY): Payer: Medicaid Other

## 2015-05-04 ENCOUNTER — Inpatient Hospital Stay (HOSPITAL_COMMUNITY)
Admission: EM | Admit: 2015-05-04 | Discharge: 2015-05-07 | DRG: 391 | Disposition: A | Discharge status: Home / Self Care | Payer: Medicaid Other | Attending: Internal Medicine | Admitting: Internal Medicine

## 2015-05-04 ENCOUNTER — Encounter (HOSPITAL_COMMUNITY): Payer: Self-pay | Admitting: Emergency Medicine

## 2015-05-04 DIAGNOSIS — R079 Chest pain, unspecified: Secondary | ICD-10-CM | POA: Diagnosis not present

## 2015-05-04 DIAGNOSIS — Z87891 Personal history of nicotine dependence: Secondary | ICD-10-CM

## 2015-05-04 DIAGNOSIS — A084 Viral intestinal infection, unspecified: Secondary | ICD-10-CM | POA: Diagnosis not present

## 2015-05-04 DIAGNOSIS — Z8249 Family history of ischemic heart disease and other diseases of the circulatory system: Secondary | ICD-10-CM

## 2015-05-04 DIAGNOSIS — Z21 Asymptomatic human immunodeficiency virus [HIV] infection status: Secondary | ICD-10-CM | POA: Diagnosis not present

## 2015-05-04 DIAGNOSIS — E43 Unspecified severe protein-calorie malnutrition: Secondary | ICD-10-CM | POA: Diagnosis present

## 2015-05-04 DIAGNOSIS — Z681 Body mass index (BMI) 19 or less, adult: Secondary | ICD-10-CM

## 2015-05-04 DIAGNOSIS — I10 Essential (primary) hypertension: Secondary | ICD-10-CM | POA: Diagnosis present

## 2015-05-04 DIAGNOSIS — E860 Dehydration: Secondary | ICD-10-CM | POA: Diagnosis present

## 2015-05-04 DIAGNOSIS — R112 Nausea with vomiting, unspecified: Secondary | ICD-10-CM | POA: Diagnosis present

## 2015-05-04 DIAGNOSIS — B20 Human immunodeficiency virus [HIV] disease: Secondary | ICD-10-CM | POA: Diagnosis present

## 2015-05-04 DIAGNOSIS — E876 Hypokalemia: Secondary | ICD-10-CM | POA: Diagnosis present

## 2015-05-04 LAB — COMPREHENSIVE METABOLIC PANEL
ALBUMIN: 3.4 g/dL — AB (ref 3.5–5.0)
ALT: 53 U/L (ref 17–63)
ANION GAP: 5 (ref 5–15)
AST: 32 U/L (ref 15–41)
Alkaline Phosphatase: 81 U/L (ref 38–126)
BILIRUBIN TOTAL: 0.4 mg/dL (ref 0.3–1.2)
BUN: 17 mg/dL (ref 6–20)
CO2: 23 mmol/L (ref 22–32)
Calcium: 8.3 mg/dL — ABNORMAL LOW (ref 8.9–10.3)
Chloride: 110 mmol/L (ref 101–111)
Creatinine, Ser: 1.2 mg/dL (ref 0.61–1.24)
GFR calc Af Amer: >60 mL/min (ref >60)
Glucose, Bld: 86 mg/dL (ref 65–99)
Potassium: 2.5 mmol/L — CL (ref 3.5–5.1)
SODIUM: 138 mmol/L (ref 135–145)
TOTAL PROTEIN: 7.4 g/dL (ref 6.5–8.1)

## 2015-05-04 LAB — CBC WITH DIFFERENTIAL/PLATELET
Basophils Absolute: 0 10*3/uL (ref 0.0–0.1)
Basophils Relative: 0 % (ref 0–1)
EOS PCT: 5 % (ref 0–5)
Eosinophils Absolute: 0.2 10*3/uL (ref 0.0–0.7)
HCT: 35.4 % — ABNORMAL LOW (ref 39.0–52.0)
Hemoglobin: 12 g/dL — ABNORMAL LOW (ref 13.0–17.0)
LYMPHS PCT: 20 % (ref 12–46)
Lymphs Abs: 0.7 10*3/uL (ref 0.7–4.0)
MCH: 32.6 pg (ref 26.0–34.0)
MCHC: 33.9 g/dL (ref 30.0–36.0)
MCV: 96.2 fL (ref 78.0–100.0)
Monocytes Absolute: 0.4 10*3/uL (ref 0.1–1.0)
Monocytes Relative: 11 % (ref 3–12)
NEUTROS ABS: 2.4 10*3/uL (ref 1.7–7.7)
Neutrophils Relative %: 64 % (ref 43–77)
PLATELETS: 243 10*3/uL (ref 150–400)
RBC: 3.68 MIL/uL — ABNORMAL LOW (ref 4.22–5.81)
RDW: 16.4 % — ABNORMAL HIGH (ref 11.5–15.5)
WBC: 3.7 10*3/uL — ABNORMAL LOW (ref 4.0–10.5)

## 2015-05-04 LAB — TROPONIN I: Troponin I: <0.03 ng/mL (ref <0.031)

## 2015-05-04 LAB — URINALYSIS, ROUTINE W REFLEX MICROSCOPIC
Bilirubin Urine: NEGATIVE
GLUCOSE, UA: NEGATIVE mg/dL
Ketones, ur: NEGATIVE mg/dL
LEUKOCYTES UA: NEGATIVE
NITRITE: NEGATIVE
PROTEIN: 30 mg/dL — AB
Specific Gravity, Urine: 1.015 (ref 1.005–1.030)
Urobilinogen, UA: 0.2 mg/dL (ref 0.0–1.0)
pH: 6 (ref 5.0–8.0)

## 2015-05-04 LAB — URINE MICROSCOPIC-ADD ON

## 2015-05-04 LAB — PROTIME-INR
INR: 1.09 (ref 0.00–1.49)
Prothrombin Time: 14.3 seconds (ref 11.6–15.2)

## 2015-05-04 LAB — I-STAT CG4 LACTIC ACID, ED: LACTIC ACID, VENOUS: 0.69 mmol/L (ref 0.5–2.0)

## 2015-05-04 LAB — MAGNESIUM: Magnesium: 1.3 mg/dL — ABNORMAL LOW (ref 1.7–2.4)

## 2015-05-04 MED ORDER — SODIUM CHLORIDE 0.9 % IV SOLN
INTRAVENOUS | Status: DC
  Administered 2015-05-05: 01:00:00 via INTRAVENOUS

## 2015-05-04 MED ORDER — SODIUM CHLORIDE 0.9 % IV BOLUS (SEPSIS)
1000.0000 mL | Freq: Once | INTRAVENOUS | Status: AC
  Administered 2015-05-04: 1000 mL via INTRAVENOUS

## 2015-05-04 MED ORDER — SODIUM CHLORIDE 0.9 % IV SOLN
Freq: Once | INTRAVENOUS | Status: DC
Start: 2015-05-04 — End: 2015-05-05

## 2015-05-04 MED ORDER — POTASSIUM CHLORIDE 10 MEQ/100ML IV SOLN
10.0000 meq | INTRAVENOUS | Status: AC
  Administered 2015-05-04 – 2015-05-05 (×3): 10 meq via INTRAVENOUS
  Filled 2015-05-04 (×3): qty 100

## 2015-05-04 MED ORDER — POTASSIUM CHLORIDE CRYS ER 20 MEQ PO TBCR
40.0000 meq | EXTENDED_RELEASE_TABLET | Freq: Once | ORAL | Status: AC
  Administered 2015-05-04: 40 meq via ORAL
  Filled 2015-05-04: qty 2

## 2015-05-04 NOTE — ED Provider Notes (Signed)
CSN: [id number]     Arrival date & time 05/04/15  2120 History  This chart was scribed for Glynn Octave, MD by Tanda Rockers, ED Scribe. This patient was seen in room APA06/APA06 and the patient's care was started at 9:42 PM.  Chief Complaint  Patient presents with  . Chest Pain   The history is provided by the patient. No language interpreter was used.     HPI Comments: Kenneth Duran is a 54 y.o. male with hx HTN and HIV who presents to the Emergency Department complaining of sudden onset, center chest pain that began tonight. He also complains of generalized weakness, mild productive cough, headache, post tussive vomiting, and testicular pain. He notes abdominal pain that has been ongoing for "awhile." Denies rash, shortness of breath, diarrhea, constipation, urinary changes, dysuria, hematuria, or any other associated symptoms. Pt reports that he may have missed a couple of days of his HIV medication. No hx CAD, MI, or stents.   Past Medical History  Diagnosis Date  . HIV (human immunodeficiency virus infection)   . Hypertension    History reviewed. No pertinent past surgical history. Family History  Problem Relation Age of Onset  . Cancer Father   . Cancer Sister   . Hypertension Sister   . Cancer Other    History  Substance Use Topics  . Smoking status: Former Games developer  . Smokeless tobacco: Never Used  . Alcohol Use: No    Review of Systems  A complete 10 system review of systems was obtained and all systems are negative except as noted in the HPI and PMH.    Allergies  Review of patient's allergies indicates no known allergies.  Home Medications   Prior to Admission medications   Medication Sig Start Date End Date Taking? Authorizing Provider  amLODipine (NORVASC) 5 MG tablet Take 5 mg by mouth daily.   Yes Historical Provider, MD  dolutegravir (TIVICAY) 50 MG tablet Take 50 mg by mouth daily.   Yes Historical Provider, MD  feeding supplement, RESOURCE BREEZE,  (RESOURCE BREEZE) LIQD Take 1 Container by mouth 2 (two) times daily between meals. 04/14/15  Yes Standley Brooking, MD  PREZCOBIX 800-150 MG per tablet Take 1 tablet by mouth daily. 04/03/15  Yes Historical Provider, MD  rilpivirine (EDURANT) 25 MG TABS tablet Take 25 mg by mouth daily with breakfast.   Yes Historical Provider, MD  ciprofloxacin (CIPRO) 500 MG tablet Take 1 tablet (500 mg total) by mouth 2 (two) times daily. Patient not taking: Reported on 05/04/2015 04/14/15   Standley Brooking, MD  metroNIDAZOLE (FLAGYL) 500 MG tablet Take 1 tablet (500 mg total) by mouth every 8 (eight) hours. Patient not taking: Reported on 05/04/2015 04/14/15   Standley Brooking, MD   Triage Vitals: BP 129/98 mmHg  Pulse 80  Temp(Src) 98.2 F (36.8 C) (Oral)  Resp 17  Ht 5\' 9"  (1.753 m)  Wt 115 lb (52.164 kg)  BMI 16.97 kg/m2  SpO2 99%   Physical Exam  Constitutional: He is oriented to person, place, and time. He appears well-developed. No distress.  Cachetic  HENT:  Head: Normocephalic and atraumatic.  Mouth/Throat: Oropharynx is clear and moist. Mucous membranes are dry. No oropharyngeal exudate.  Temporal muscle wasting  Sunken eyes  Eyes: Conjunctivae and EOM are normal. Pupils are equal, round, and reactive to light.  Neck: Normal range of motion. Neck supple.  No meningismus.  Cardiovascular: Normal rate, regular rhythm, normal heart sounds and intact distal pulses.  No murmur heard. Pulmonary/Chest: Effort normal and breath sounds normal. No respiratory distress. He exhibits tenderness.  Left sided chest wall tenderness  Abdominal: Soft. There is tenderness (Diffusely tender). There is no rebound and no guarding.  Musculoskeletal: Normal range of motion. He exhibits no edema or tenderness.  Neurological: He is alert and oriented to person, place, and time. No cranial nerve deficit. He exhibits normal muscle tone. Coordination normal.  No ataxia on finger to nose bilaterally. No pronator  drift. 5/5 strength throughout. CN 2-12 intact.Equal grip strength. Sensation intact.  Skin: Skin is warm.  Psychiatric: He has a normal mood and affect. His behavior is normal.  Nursing note and vitals reviewed.   ED Course  Procedures (including critical care time)  DIAGNOSTIC STUDIES: Oxygen Saturation is 99% on RA, normal by my interpretation.    COORDINATION OF CARE: 9:49 PM-Discussed treatment plan which includes DG Abdominal series, CBC, CMP, Lactic acid, Blood culture, Troponin, UA, Urine culture, and Protime INR with pt at bedside and pt agreed to plan.   Labs Review Labs Reviewed  CBC WITH DIFFERENTIAL/PLATELET - Abnormal; Notable for the following:    WBC 3.7 (*)    RBC 3.68 (*)    Hemoglobin 12.0 (*)    HCT 35.4 (*)    RDW 16.4 (*)    All other components within normal limits  COMPREHENSIVE METABOLIC PANEL - Abnormal; Notable for the following:    Potassium 2.5 (*)    Calcium 8.3 (*)    Albumin 3.4 (*)    All other components within normal limits  URINALYSIS, ROUTINE W REFLEX MICROSCOPIC (NOT AT [facility]) - Abnormal; Notable for the following:    Hgb urine dipstick TRACE (*)    Protein, ur 30 (*)    All other components within normal limits  MAGNESIUM - Abnormal; Notable for the following:    Magnesium 1.3 (*)    All other components within normal limits  URINE MICROSCOPIC-ADD ON - Abnormal; Notable for the following:    Casts GRANULAR CAST (*)    All other components within normal limits  CULTURE, BLOOD (ROUTINE X 2)  CULTURE, BLOOD (ROUTINE X 2)  URINE CULTURE  TROPONIN I  PROTIME-INR  I-STAT CG4 LACTIC ACID, ED  I-STAT CG4 LACTIC ACID, ED    Imaging Review Dg Abd Acute W/chest  05/04/2015   CLINICAL DATA:  Mid chest pain, generalized abdominal pain, vomiting. Shortness wreck, productive cough.  EXAM: DG ABDOMEN ACUTE W/ 1V CHEST  COMPARISON:  04/12/2015  FINDINGS: Nodular density projects over the right upper lobe, stable since prior study. Heart is normal  size. Left lung is clear. No effusions.  Mild diffuse gaseous distention of bowel, similar or slightly worsened since prior study. Scattered air-fluid levels. No free air. No organomegaly or suspicious calcification.  IMPRESSION: Continued diffuse gaseous distention of bowel, slightly progressed since prior study. Favor ileus or enteritis. No free air.  Stable nodular density over the right upper lobe. Recommend further evaluation with chest CT.   Electronically Signed   By: Charlett Nose M.D.   On: 05/04/2015 22:51     EKG Interpretation   Date/Time:  Friday May 04 2015 21:25:55 EDT Ventricular Rate:  77 PR Interval:  160 QRS Duration: 92 QT Interval:  404 QTC Calculation: 457 R Axis:   74 Text Interpretation:  Sinus rhythm ST elev, probable normal early repol  pattern No significant change was found Confirmed by Manus Gunning  MD, Shian Goodnow  [phone]) on 05/04/2015 9:32:49 PM  MDM   Final diagnoses:  Hypokalemia  AIDS  Dehydration   Patient with history of AIDS presented with one-day history of weakness, subjective fever, chest pain, cough, abdominal pain.  Chest pain is reproducible on exam. EKG is unchanged. He denies any fever. CD4 undetectable per [facility] in June.    Labs remarkable for hypokalemia and hypomagnesemia.  Replacements ordered   CXR with stable lung nodule. UA negative. lacate normal.  Cultures sent. With significant abdominal pain, will obtain CT. Chest pain atypical for ACS and reproducible.  Will also image chest to further evaluate lung nodule.  IVF and IV potassium ordered.  D/w Dr. Sharl Ma.   I personally performed the services described in this documentation, which was scribed in my presence. The recorded information has been reviewed and is accurate.     Glynn Octave, MD 05/05/15 [phone]

## 2015-05-04 NOTE — ED Notes (Signed)
CRITICAL VALUE ALERT  Critical value received:  Potassium 2.5  Date of notification:  05/04/15  Time of notification: 2308 hrs  Critical value read back:Yes.    Nurse who received alert:  Y. Tjuana Vickrey, RN  MD notified (1st page):  Dr. Lajean Saver  Time of first page:  2308 hrs  Responding MD:  Dr. Lajean Saver  Time MD responded:  2309 hrs

## 2015-05-04 NOTE — ED Notes (Signed)
Per EMS patient called from home due to chest pain with diaphoresis and vomiting. Denies diarrhea.

## 2015-05-04 NOTE — H&P (Addendum)
PCP:   Verneita Griffes, NP   Chief Complaint:  Chest pain  HPI: 54 year old male who   has a past medical history of HIV (human immunodeficiency virus infection) and Hypertension. Today comes in the hospital with complaint of chest pain. Patient says the chest pain started tonight and also has been having generalized weakness mild cough and headache. Patient also had vomiting and abdominal pain. Denies dysuria urgency frequency of urination. Patient has history of HIV and has missed couple of days of his medications. Patient denies any history of CAD In the ED patient found to have hypokalemia Patient also had diarrhea for past 3 days.  Allergies:  No Known Allergies    Past Medical History  Diagnosis Date  . HIV (human immunodeficiency virus infection)   . Hypertension     History reviewed. No pertinent past surgical history.  Prior to Admission medications   Medication Sig Start Date End Date Taking? Authorizing Provider  amLODipine (NORVASC) 5 MG tablet Take 5 mg by mouth daily.   Yes Historical Provider, MD  dolutegravir (TIVICAY) 50 MG tablet Take 50 mg by mouth daily.   Yes Historical Provider, MD  feeding supplement, RESOURCE BREEZE, (RESOURCE BREEZE) LIQD Take 1 Container by mouth 2 (two) times daily between meals. 04/14/15  Yes Standley Brooking, MD  PREZCOBIX 800-150 MG per tablet Take 1 tablet by mouth daily. 04/03/15  Yes Historical Provider, MD  rilpivirine (EDURANT) 25 MG TABS tablet Take 25 mg by mouth daily with breakfast.   Yes Historical Provider, MD  ciprofloxacin (CIPRO) 500 MG tablet Take 1 tablet (500 mg total) by mouth 2 (two) times daily. Patient not taking: Reported on 05/04/2015 04/14/15   Standley Brooking, MD  metroNIDAZOLE (FLAGYL) 500 MG tablet Take 1 tablet (500 mg total) by mouth every 8 (eight) hours. Patient not taking: Reported on 05/04/2015 04/14/15   Standley Brooking, MD    Social History:  reports that he has quit smoking. He has never  used smokeless tobacco. He reports that he does not drink alcohol or use illicit drugs.  Family History  Problem Relation Age of Onset  . Cancer Father   . Cancer Sister   . Hypertension Sister   . Cancer Other     Filed Weights   05/04/15 2129  Weight: 52.164 kg (115 lb)    All the positives are listed in BOLD  Review of Systems:  HEENT: Headache, blurred vision, runny nose, sore throat Neck: Hypothyroidism, hyperthyroidism,,lymphadenopathy Chest : Shortness of breath, history of COPD, Asthma Heart : Chest pain, history of coronary arterey disease GI:  Nausea, vomiting, diarrhea, constipation, GERD GU: Dysuria, urgency, frequency of urination, hematuria Neuro: Stroke, seizures, syncope Psych: Depression, anxiety, hallucinations   Physical Exam: Blood pressure 119/102, pulse 82, temperature 98.2 F (36.8 C), temperature source Oral, resp. rate 17, height  (1.753 m), weight 52.164 kg (115 lb), SpO2 99 %. Constitutional:   Patient is a well-developed and well-nourished male* in no acute distress and cooperative with exam. Head: Normocephalic and atraumatic Mouth: Mucus membranes moist Eyes: PERRL, EOMI, conjunctivae normal Neck: Supple, No Thyromegaly Cardiovascular: RRR, S1 normal, S2 normal Pulmonary/Chest: CTAB, no wheezes, rales, or rhonchi Abdominal: Soft. Mild generalized tenderness , non-distended, bowel sounds are normal, no masses, organomegaly, or guarding present.  Neurological: A&O x3, Strength is normal and symmetric bilaterally, cranial nerve II-XII are grossly intact, no focal motor deficit, sensory intact to light touch bilaterally.  Extremities : No Cyanosis, Clubbing or  Edema  Labs on Admission:  Basic Metabolic Panel:  Recent Labs Lab 05/04/15 2213  NA 138  K 2.5*  CL 110  CO2 23  GLUCOSE 86  BUN 17  CREATININE 1.20  CALCIUM 8.3*  MG 1.3*   Liver Function Tests:  Recent Labs Lab 05/04/15 2213  AST 32  ALT 53  ALKPHOS 81  BILITOT  0.4  PROT 7.4  ALBUMIN 3.4*   No results for input(s): LIPASE, AMYLASE in the last 168 hours. No results for input(s): AMMONIA in the last 168 hours. CBC:  Recent Labs Lab 05/04/15 2213  WBC 3.7*  NEUTROABS 2.4  HGB 12.0*  HCT 35.4*  MCV 96.2  PLT 243   Cardiac Enzymes:  Recent Labs Lab 05/04/15 2213  TROPONINI <0.03     Radiological Exams on Admission: Dg Abd Acute W/chest  05/04/2015   CLINICAL DATA:  Mid chest pain, generalized abdominal pain, vomiting. Shortness wreck, productive cough.  EXAM: DG ABDOMEN ACUTE W/ 1V CHEST  COMPARISON:  04/12/2015  FINDINGS: Nodular density projects over the right upper lobe, stable since prior study. Heart is normal size. Left lung is clear. No effusions.  Mild diffuse gaseous distention of bowel, similar or slightly worsened since prior study. Scattered air-fluid levels. No free air. No organomegaly or suspicious calcification.  IMPRESSION: Continued diffuse gaseous distention of bowel, slightly progressed since prior study. Favor ileus or enteritis. No free air.  Stable nodular density over the right upper lobe. Recommend further evaluation with chest CT.   Electronically Signed   By: Charlett Nose M.D.   On: 05/04/2015 22:51    EKG: Independently reviewed. Sinus rhythm   Assessment/Plan Active Problems:   HIV (human immunodeficiency virus infection)   Hypokalemia   Nausea & vomiting   AIDS   Chest pain  Chest pain Will admit the patient, rule out ACS. EKG shows sinus rhythm with early repolarization changes. Will cycle  Troponin every 6 hours 3.  Hypokalemia Replace potassium, and check BMP in a.m.  Abdominal pain Abdominal CT is negative for acute pathology.    Nodular density in the right upper lobe Chest x-ray showed nodular density inferior right upper lobe, CT chest showed benign lesion. Follow up recommended.  AIDS Continue and antiretroviral therapy  Nausea vomiting and diarrhea Likely gastroenteritis, will  obtain GI pathogen panel Zofran when necessary for nausea vomiting    Code status: Full code  Family discussion: Admission, patients condition and plan of care including tests being ordered have been discussed with the patient and *his wife on phone.   Time Spent on Admission: 60 minutes  Sencere Symonette S [facility] Pager: [phone] 05/04/2015, 11:56 PM  If 7PM-7AM, please contact night-coverage  [email or web address]  Password [email or web address]

## 2015-05-05 ENCOUNTER — Encounter (HOSPITAL_COMMUNITY): Payer: Self-pay | Admitting: *Deleted

## 2015-05-05 LAB — CBC
HEMATOCRIT: 34.9 % — AB (ref 39.0–52.0)
Hemoglobin: 11.7 g/dL — ABNORMAL LOW (ref 13.0–17.0)
MCH: 32.3 pg (ref 26.0–34.0)
MCHC: 33.5 g/dL (ref 30.0–36.0)
MCV: 96.4 fL (ref 78.0–100.0)
Platelets: 225 10*3/uL (ref 150–400)
RBC: 3.62 MIL/uL — ABNORMAL LOW (ref 4.22–5.81)
RDW: 16.5 % — AB (ref 11.5–15.5)
WBC: 4.4 10*3/uL (ref 4.0–10.5)

## 2015-05-05 LAB — COMPREHENSIVE METABOLIC PANEL
ALT: 63 U/L (ref 17–63)
AST: 57 U/L — AB (ref 15–41)
Albumin: 3.1 g/dL — ABNORMAL LOW (ref 3.5–5.0)
Alkaline Phosphatase: 78 U/L (ref 38–126)
Anion gap: 6 (ref 5–15)
BUN: 14 mg/dL (ref 6–20)
CALCIUM: 8.3 mg/dL — AB (ref 8.9–10.3)
CO2: 18 mmol/L — ABNORMAL LOW (ref 22–32)
CREATININE: 1.09 mg/dL (ref 0.61–1.24)
Chloride: 111 mmol/L (ref 101–111)
GFR calc non Af Amer: >60 mL/min (ref >60)
Glucose, Bld: 73 mg/dL (ref 65–99)
Potassium: 3.2 mmol/L — ABNORMAL LOW (ref 3.5–5.1)
SODIUM: 135 mmol/L (ref 135–145)
Total Bilirubin: 0.3 mg/dL (ref 0.3–1.2)
Total Protein: 6.9 g/dL (ref 6.5–8.1)

## 2015-05-05 LAB — TROPONIN I
Troponin I: <0.03 ng/mL (ref <0.031)
Troponin I: <0.03 ng/mL (ref <0.031)

## 2015-05-05 MED ORDER — ACETAMINOPHEN 650 MG RE SUPP: 650.0000 mg | Freq: Four times a day (QID) | RECTAL | Status: DC | PRN

## 2015-05-05 MED ORDER — ENSURE ENLIVE PO LIQD
237.0000 mL | Freq: Three times a day (TID) | ORAL | Status: DC
  Administered 2015-05-05 – 2015-05-07 (×4): 237 mL via ORAL

## 2015-05-05 MED ORDER — POTASSIUM CHLORIDE CRYS ER 20 MEQ PO TBCR
40.0000 meq | EXTENDED_RELEASE_TABLET | ORAL | Status: AC
Start: 2015-05-05 — End: 2015-05-05
  Administered 2015-05-05 (×2): 40 meq via ORAL
  Filled 2015-05-05 (×2): qty 2

## 2015-05-05 MED ORDER — AMLODIPINE BESYLATE 5 MG PO TABS
5.0000 mg | ORAL_TABLET | Freq: Every day | ORAL | Status: DC
  Administered 2015-05-05 – 2015-05-07 (×3): 5 mg via ORAL
  Filled 2015-05-05 (×3): qty 1

## 2015-05-05 MED ORDER — RILPIVIRINE HCL 25 MG PO TABS
25.0000 mg | ORAL_TABLET | Freq: Every day | ORAL | Status: DC
  Administered 2015-05-05 – 2015-05-07 (×3): 25 mg via ORAL
  Filled 2015-05-05 (×5): qty 1

## 2015-05-05 MED ORDER — ENOXAPARIN SODIUM 40 MG/0.4ML ~~LOC~~ SOLN
40.0000 mg | SUBCUTANEOUS | Status: DC
  Administered 2015-05-05 – 2015-05-06 (×2): 40 mg via SUBCUTANEOUS
  Filled 2015-05-05 (×3): qty 0.4

## 2015-05-05 MED ORDER — ONDANSETRON HCL 4 MG PO TABS: 4.0000 mg | ORAL_TABLET | Freq: Four times a day (QID) | ORAL | Status: DC | PRN

## 2015-05-05 MED ORDER — ACETAMINOPHEN 325 MG PO TABS: 650.0000 mg | ORAL_TABLET | Freq: Four times a day (QID) | ORAL | Status: DC | PRN

## 2015-05-05 MED ORDER — ONDANSETRON HCL 4 MG/2ML IJ SOLN
4.0000 mg | Freq: Four times a day (QID) | INTRAMUSCULAR | Status: DC | PRN
  Administered 2015-05-06: 4 mg via INTRAVENOUS
  Filled 2015-05-05: qty 2

## 2015-05-05 MED ORDER — MORPHINE SULFATE 2 MG/ML IJ SOLN: 1.0000 mg | INTRAMUSCULAR | Status: DC | PRN

## 2015-05-05 MED ORDER — SODIUM CHLORIDE 0.9 % IV SOLN
INTRAVENOUS | Status: DC
  Administered 2015-05-05 – 2015-05-06 (×3): via INTRAVENOUS

## 2015-05-05 MED ORDER — IOHEXOL 300 MG/ML  SOLN
100.0000 mL | Freq: Once | INTRAMUSCULAR | Status: AC | PRN
  Administered 2015-05-05: 100 mL via INTRAVENOUS

## 2015-05-05 MED ORDER — DOLUTEGRAVIR SODIUM 50 MG PO TABS
50.0000 mg | ORAL_TABLET | Freq: Every day | ORAL | Status: DC
  Administered 2015-05-05 – 2015-05-07 (×3): 50 mg via ORAL
  Filled 2015-05-05 (×5): qty 1

## 2015-05-05 MED ORDER — BOOST / RESOURCE BREEZE PO LIQD: 1.0000 | Freq: Two times a day (BID) | ORAL | Status: DC

## 2015-05-05 MED ORDER — IOHEXOL 300 MG/ML  SOLN
50.0000 mL | Freq: Once | INTRAMUSCULAR | Status: AC | PRN
  Administered 2015-05-05: 50 mL via ORAL

## 2015-05-05 MED ORDER — DARUNAVIR-COBICISTAT 800-150 MG PO TABS
1.0000 | ORAL_TABLET | Freq: Every day | ORAL | Status: DC
Start: 2015-05-05 — End: 2015-05-07
  Administered 2015-05-05 – 2015-05-07 (×3): 1 via ORAL
  Filled 2015-05-05 (×5): qty 1

## 2015-05-05 MED ORDER — MAGNESIUM OXIDE 400 (241.3 MG) MG PO TABS
400.0000 mg | ORAL_TABLET | Freq: Three times a day (TID) | ORAL | Status: DC
  Administered 2015-05-05 – 2015-05-07 (×5): 400 mg via ORAL
  Filled 2015-05-05 (×6): qty 1

## 2015-05-05 MED ORDER — MAGNESIUM SULFATE 2 GM/50ML IV SOLN
2.0000 g | Freq: Once | INTRAVENOUS | Status: AC
  Administered 2015-05-05: 2 g via INTRAVENOUS
  Filled 2015-05-05: qty 50

## 2015-05-05 NOTE — Progress Notes (Signed)
[facility] PROGRESS NOTE  Trai Ells ZOX:[id number] DOB: 09-06-1961 DOA: 05/04/2015 PCP: Verneita Griffes, NP  Assessment/Plan: Nausea/vomiting/diarrhea -No episodes since admission to the hospital. -Likely represents acute viral gastroenteritis, however given his history of HIV to consider other opportunistic infections as well. -GI pathogen panel has been ordered, however has not been collected as patient has not had further stooling.  Hypokalemia/hypomagnesemia -Secondary to GI losses. -Replete orally and IV, recheck levels in the morning.  HIV -Check CD4 count and viral load, patient states he follows with a physician in [place]. -Continue anti-retroviral therapy.  Chest pain -Resolved. -Suspect GI in origin related to acute GI illness. -Has ruled out for acute coronary syndrome, no plans for further cardiac workup at present.  Severe protein caloric malnutrition -Related to HIV. -Dietary supplements.  Code Status: Full code Family Communication: Patient only  Disposition Plan: Home when ready, anticipate 24-48 hours   Consultants:  None   Antibiotics:  None   Subjective: No current complaints, states he feels improved from admission  Objective: Filed Vitals:   05/05/15 0030 05/05/15 0100 05/05/15 0241 05/05/15 0553  BP: 122/97 134/104 128/89 123/94  Pulse: 65 72 63 68  Temp:   97.8 F (36.6 C) 97.4 F (36.3 C)  TempSrc:   Oral Oral  Resp: Height:    (1.753 m)   Weight:   53.8 kg (118 lb 9.7 oz)   SpO2: 99% 100% 100% 100%    Intake/Output Summary (Last 24 hours) at 05/05/15 1502 Last data filed at 05/05/15 0700  Gross per 24 hour  Intake 526.67 ml  Output    350 ml  Net 176.67 ml   Filed Weights   05/04/15 2129 05/05/15 0241  Weight: 52.164 kg (115 lb) 53.8 kg (118 lb 9.7 oz)    Exam:   General:  Alert, awake, oriented 3, cachectic with facial wasting  Cardiovascular: Regular rate and rhythm,  no murmurs, rubs or gallops  Respiratory: Clear to auscultation bilaterally  Abdomen: Soft, nontender, nondistended, positive bowel sounds  Extremities: No clubbing, stenosis or edema, positive pulses   Neurologic:  Grossly intact and nonfocal  Data Reviewed: Basic Metabolic Panel:  Recent Labs Lab 05/04/15 2213 05/05/15 0831  NA 138 135  K 2.5* 3.2*  CL 110 111  CO2 23 18*  GLUCOSE 86 73  BUN 17 14  CREATININE 1.20 1.09  CALCIUM 8.3* 8.3*  MG 1.3*  --    Liver Function Tests:  Recent Labs Lab 05/04/15 2213 05/05/15 0831  AST 32 57*  ALT 53 63  ALKPHOS 81 78  BILITOT 0.4 0.3  PROT 7.4 6.9  ALBUMIN 3.4* 3.1*   No results for input(s): LIPASE, AMYLASE in the last 168 hours. No results for input(s): AMMONIA in the last 168 hours. CBC:  Recent Labs Lab 05/04/15 2213 05/05/15 0831  WBC 3.7* 4.4  NEUTROABS 2.4  --   HGB 12.0* 11.7*  HCT 35.4* 34.9*  MCV 96.2 96.4  PLT 243 225   Cardiac Enzymes:  Recent Labs Lab 05/04/15 2213 05/05/15 0831  TROPONINI <0.03 <0.03   BNP (last 3 results) No results for input(s): BNP in the last 8760 hours.  ProBNP (last 3 results) No results for input(s): PROBNP in the last 8760 hours.  CBG: No results for input(s): GLUCAP in the last 168 hours.  Recent Results (from the past 240 hour(s))  Blood culture (routine x 2)     Status: None (Preliminary result)  Collection Time: 05/04/15 10:13 PM  Result Value Ref Range Status   Specimen Description BLOOD RIGHT ARM  Final   Special Requests BOTTLES DRAWN AEROBIC AND ANAEROBIC 8 CC EACH  Final   Culture NO GROWTH < 12 HOURS  Final   Report Status PENDING  Incomplete  Blood culture (routine x 2)     Status: None (Preliminary result)   Collection Time: 05/04/15 10:52 PM  Result Value Ref Range Status   Specimen Description BLOOD RIGHT ARM  Final   Special Requests BOTTLES DRAWN AEROBIC AND ANAEROBIC 10 CC EACH  Final   Culture NO GROWTH < 12 HOURS  Final   Report  Status PENDING  Incomplete     Studies: Ct Chest W Contrast  05/05/2015   CLINICAL DATA:  Midsternal and right-sided chest pain. Nausea vomiting and diarrhea.  EXAM: CT CHEST, ABDOMEN, AND PELVIS WITH CONTRAST  TECHNIQUE: Multidetector CT imaging of the chest, abdomen and pelvis was performed following the standard protocol during bolus administration of intravenous contrast.  CONTRAST:  50mL OMNIPAQUE IOHEXOL 300 MG/ML SOLN, OMNIPAQUE IOHEXOL 300 MG/ML SOLN  COMPARISON:  None.  FINDINGS: CT CHEST FINDINGS  There is mild nodularity in the right upper lobe, with appearances most suggestive of nodular scarring. The most prominent measures approximately 7 mm in the right upper lobe laterally, axial image 24 series 6 and coronal image 70 series 3. A smaller 4 mm area of mild nodularity is present on axial image 18 series 6. No ground-glass opacities. No mediastinal or hilar adenopathy. No pleural effusions. Axillary and supraclavicular regions appear unremarkable.  CT ABDOMEN AND PELVIS FINDINGS  There is moderate dilatation of small and large bowel, predominantly fluid-filled. There is no bowel obstruction. There is no extraluminal air. There is no focal inflammatory change.  There are normal appearances of the liver, spleen, pancreas, adrenals and kidneys. There is no adenopathy in the abdomen or pelvis. There is no ascites.  No significant musculoskeletal abnormality is evident.  IMPRESSION: 1. Mild nodularity in the right upper lobe lung, indeterminate but more likely benign. Depending on clinical setting, conservative follow-up will probably be reasonable. No confluent airspace consolidation. No ground-glass opacities. No effusions or adenopathy. 2. There is diffuse dilatation of small and large bowel with a large volume of intraluminal fluid. This is consistent with the clinically described history of a diarrheal illness. There is no bowel obstruction. There is no perforation.   Electronically Signed    By: Ellery Plunk M.D.   On: 05/05/2015 02:10   Ct Abdomen Pelvis W Contrast  05/05/2015   CLINICAL DATA:  Midsternal and right-sided chest pain. Nausea vomiting and diarrhea.  EXAM: CT CHEST, ABDOMEN, AND PELVIS WITH CONTRAST  TECHNIQUE: Multidetector CT imaging of the chest, abdomen and pelvis was performed following the standard protocol during bolus administration of intravenous contrast.  CONTRAST:  50mL OMNIPAQUE IOHEXOL 300 MG/ML SOLN, OMNIPAQUE IOHEXOL 300 MG/ML SOLN  COMPARISON:  None.  FINDINGS: CT CHEST FINDINGS  There is mild nodularity in the right upper lobe, with appearances most suggestive of nodular scarring. The most prominent measures approximately 7 mm in the right upper lobe laterally, axial image 24 series 6 and coronal image 70 series 3. A smaller 4 mm area of mild nodularity is present on axial image 18 series 6. No ground-glass opacities. No mediastinal or hilar adenopathy. No pleural effusions. Axillary and supraclavicular regions appear unremarkable.  CT ABDOMEN AND PELVIS FINDINGS  There is moderate dilatation of small and  large bowel, predominantly fluid-filled. There is no bowel obstruction. There is no extraluminal air. There is no focal inflammatory change.  There are normal appearances of the liver, spleen, pancreas, adrenals and kidneys. There is no adenopathy in the abdomen or pelvis. There is no ascites.  No significant musculoskeletal abnormality is evident.  IMPRESSION: 1. Mild nodularity in the right upper lobe lung, indeterminate but more likely benign. Depending on clinical setting, conservative follow-up will probably be reasonable. No confluent airspace consolidation. No ground-glass opacities. No effusions or adenopathy. 2. There is diffuse dilatation of small and large bowel with a large volume of intraluminal fluid. This is consistent with the clinically described history of a diarrheal illness. There is no bowel obstruction. There is no perforation.    Electronically Signed   By: Ellery Plunk M.D.   On: 05/05/2015 02:10   Dg Abd Acute W/chest  05/04/2015   CLINICAL DATA:  Mid chest pain, generalized abdominal pain, vomiting. Shortness wreck, productive cough.  EXAM: DG ABDOMEN ACUTE W/ 1V CHEST  COMPARISON:  04/12/2015  FINDINGS: Nodular density projects over the right upper lobe, stable since prior study. Heart is normal size. Left lung is clear. No effusions.  Mild diffuse gaseous distention of bowel, similar or slightly worsened since prior study. Scattered air-fluid levels. No free air. No organomegaly or suspicious calcification.  IMPRESSION: Continued diffuse gaseous distention of bowel, slightly progressed since prior study. Favor ileus or enteritis. No free air.  Stable nodular density over the right upper lobe. Recommend further evaluation with chest CT.   Electronically Signed   By: Charlett Nose M.D.   On: 05/04/2015 22:51    Scheduled Meds: . amLODipine  5 mg Oral Daily  . darunavir-cobicistat  1 tablet Oral Daily  . dolutegravir  50 mg Oral Daily  . enoxaparin (LOVENOX) injection  40 mg Subcutaneous Q24H  . feeding supplement (ENSURE ENLIVE)  237 mL Oral TID BM  . magnesium oxide  400 mg Oral TID  . potassium chloride  40 mEq Oral Q4H  . rilpivirine  25 mg Oral Q breakfast   Continuous Infusions: . sodium chloride 100 mL/hr at 05/05/15 0256    Active Problems:   HIV (human immunodeficiency virus infection)   Hypokalemia   Nausea & vomiting   Hypomagnesemia   AIDS   Protein-calorie malnutrition, severe   Chest pain    Time spent: 25 minutes. Greater than 50% of this time was spent in direct contact with the patient coordinating care.    Kenneth Duran  [facility] Pager [phone]  If 7PM-7AM, please contact night-coverage at [email or web address], password [facility] 05/05/2015, 3:02 PM  LOS: 1 day

## 2015-05-05 NOTE — Progress Notes (Signed)
Initial Nutrition Assessment  DOCUMENTATION CODES:  Underweight, Severe malnutrition in context of chronic illness  INTERVENTION:  Ensure Enlive po TID, each supplement provides 350 kcal and 20 grams of protein  Recommend Vit D, MVI with mineral supplements  NUTRITION DIAGNOSIS:  Increased nutrient needs related to chronic illness as evidenced by severe depletion of muscle mass, severe depletion of body fat.  GOAL:  Patient will meet greater than or equal to 90% of their needs  MONITOR:  PO intake, Supplement acceptance, Labs, I & O's, Weight trends  REASON FOR ASSESSMENT:  Malnutrition Screening Tool    ASSESSMENT:  54 year old male PMHx HIV and Hypertension. Presents w/ chest pain, generalized weakness, mild cough and headache. Patient also had vomiting, abdominal pain and diarrhea x 3 days. In ED patient found to be hypokalemic  RD acting remotely. RD had seen pt on 7/8. At that time was diagnosed with Chronic malnutrition related to severe fat/muscle wasting. Pt's weight has not changed much and this should still be present.    Diet Order:  Diet regular Room service appropriate?: Yes; Fluid consistency:: Thin  Skin:  Dry  Last BM:  7/30  Height:  Ht Readings from Last 1 Encounters:  05/05/15  (1.753 m)   Weight:  Wt Readings from Last 1 Encounters:  05/05/15 118 lb 9.7 oz (53.8 kg)    Wt Readings from Last 10 Encounters:  05/05/15 118 lb 9.7 oz (53.8 kg)  04/14/15 117 lb 12.8 oz (53.434 kg)  Insufficient wt history  Ideal Body Weight:  72.7 kg  BMI:  Body mass index is 17.51 kg/(m^2).  Estimated Nutritional Needs:  Kcal:  1900-2150 (35-40 kcal/kg) Protein:  81-97 (1.5-1.8 g/kg) Fluid:  1.9-2.1 liters and enough to replace that which is lost in stool  EDUCATION NEEDS:  No education needs identified at this time  Christophe Louis RD, LDN Nutrition Pager: [phone] 05/05/2015 9:09 AM

## 2015-05-05 NOTE — Progress Notes (Signed)
Pt refused to let the lab technician draw blood for his labs. Notified MD.

## 2015-05-06 LAB — BASIC METABOLIC PANEL
Anion gap: 3 — ABNORMAL LOW (ref 5–15)
BUN: 11 mg/dL (ref 6–20)
CALCIUM: 8.4 mg/dL — AB (ref 8.9–10.3)
CO2: 20 mmol/L — ABNORMAL LOW (ref 22–32)
Chloride: 111 mmol/L (ref 101–111)
Creatinine, Ser: 1 mg/dL (ref 0.61–1.24)
GFR calc Af Amer: >60 mL/min (ref >60)
GLUCOSE: 77 mg/dL (ref 65–99)
Potassium: 3.6 mmol/L (ref 3.5–5.1)
SODIUM: 134 mmol/L — AB (ref 135–145)

## 2015-05-06 LAB — CLOSTRIDIUM DIFFICILE BY PCR: Toxigenic C. Difficile by PCR: NEGATIVE

## 2015-05-06 LAB — MAGNESIUM: MAGNESIUM: 1.7 mg/dL (ref 1.7–2.4)

## 2015-05-06 NOTE — Progress Notes (Signed)
[facility] PROGRESS NOTE  Kenneth Duran BJY:[id number] DOB: January 05, 1961 DOA: 05/04/2015 PCP: Verneita Griffes, NP  Assessment/Plan: Nausea/vomiting/diarrhea -Has had significant diarrhea overnight; he describes as "explosive and watery". -Sample has been sent to the lab for c Diff and stool pathogen panel. -Continue symptomatic treatment for now.  Hypokalemia/hypomagnesemia -Secondary to GI losses. -Repleted.  HIV -Check CD4 count and viral load, patient states he follows with a physician in [place]. -Continue anti-retroviral therapy.  Chest pain -Resolved. -Suspect GI in origin related to acute GI illness. -Has ruled out for acute coronary syndrome, no plans for further cardiac workup at present.  Severe protein caloric malnutrition -Related to HIV. -Dietary supplements.  Code Status: Full code Family Communication: Patient only  Disposition Plan: Home when ready, anticipate 24-48 hours   Consultants:  None   Antibiotics:  None   Subjective: No current complaints, states he feels improved from admission, but had at least 3 watery BMs overnight.  Objective: Filed Vitals:   05/05/15 0553 05/05/15 1515 05/05/15 2124 05/06/15 0505  BP: 123/94 130/93 125/91 138/89  Pulse: 68 72 80 82  Temp: 97.4 F (36.3 C) 97.6 F (36.4 C) 98.6 F (37 C) 98.5 F (36.9 C)  TempSrc: Oral  Oral Oral  Resp: 16 18 18 18   Height:      Weight:      SpO2: 100% 100% 100% 100%    Intake/Output Summary (Last 24 hours) at 05/06/15 1033 Last data filed at 05/06/15 0900  Gross per 24 hour  Intake    720 ml  Output   1500 ml  Net   -780 ml   Filed Weights   05/04/15 2129 05/05/15 0241  Weight: 52.164 kg (115 lb) 53.8 kg (118 lb 9.7 oz)    Exam:   General:  Alert, awake, oriented 3, cachectic with facial wasting  Cardiovascular: Regular rate and rhythm, no murmurs, rubs or gallops  Respiratory: Clear to auscultation bilaterally  Abdomen: Soft,  nontender, nondistended, positive bowel sounds  Extremities: No clubbing, stenosis or edema, positive pulses   Neurologic:  Grossly intact and nonfocal  Data Reviewed: Basic Metabolic Panel:  Recent Labs Lab 05/04/15 2213 05/05/15 0831 05/06/15 0630  NA 138 135 134*  K 2.5* 3.2* 3.6  CL 110 111 111  CO2 23 18* 20*  GLUCOSE 86 73 77  BUN 17 14 11   CREATININE 1.20 1.09 1.00  CALCIUM 8.3* 8.3* 8.4*  MG 1.3*  --  1.7   Liver Function Tests:  Recent Labs Lab 05/04/15 2213 05/05/15 0831  AST 32 57*  ALT 53 63  ALKPHOS 81 78  BILITOT 0.4 0.3  PROT 7.4 6.9  ALBUMIN 3.4* 3.1*   No results for input(s): LIPASE, AMYLASE in the last 168 hours. No results for input(s): AMMONIA in the last 168 hours. CBC:  Recent Labs Lab 05/04/15 2213 05/05/15 0831  WBC 3.7* 4.4  NEUTROABS 2.4  --   HGB 12.0* 11.7*  HCT 35.4* 34.9*  MCV 96.2 96.4  PLT 243 225   Cardiac Enzymes:  Recent Labs Lab 05/04/15 2213 05/05/15 0831 05/05/15 1443  TROPONINI <0.03 <0.03 <0.03   BNP (last 3 results) No results for input(s): BNP in the last 8760 hours.  ProBNP (last 3 results) No results for input(s): PROBNP in the last 8760 hours.  CBG: No results for input(s): GLUCAP in the last 168 hours.  Recent Results (from the past 240 hour(s))  Blood culture (routine x 2)     Status:  None (Preliminary result)   Collection Time: 05/04/15 10:13 PM  Result Value Ref Range Status   Specimen Description BLOOD RIGHT ARM  Final   Special Requests BOTTLES DRAWN AEROBIC AND ANAEROBIC 8 CC EACH  Final   Culture NO GROWTH 2 DAYS  Final   Report Status PENDING  Incomplete  Blood culture (routine x 2)     Status: None (Preliminary result)   Collection Time: 05/04/15 10:52 PM  Result Value Ref Range Status   Specimen Description BLOOD RIGHT ARM  Final   Special Requests BOTTLES DRAWN AEROBIC AND ANAEROBIC 10 CC EACH  Final   Culture NO GROWTH 2 DAYS  Final   Report Status PENDING  Incomplete      Studies: Ct Chest W Contrast  05/05/2015   CLINICAL DATA:  Midsternal and right-sided chest pain. Nausea vomiting and diarrhea.  EXAM: CT CHEST, ABDOMEN, AND PELVIS WITH CONTRAST  TECHNIQUE: Multidetector CT imaging of the chest, abdomen and pelvis was performed following the standard protocol during bolus administration of intravenous contrast.  CONTRAST:  50mL OMNIPAQUE IOHEXOL 300 MG/ML SOLN, OMNIPAQUE IOHEXOL 300 MG/ML SOLN  COMPARISON:  None.  FINDINGS: CT CHEST FINDINGS  There is mild nodularity in the right upper lobe, with appearances most suggestive of nodular scarring. The most prominent measures approximately 7 mm in the right upper lobe laterally, axial image 24 series 6 and coronal image 70 series 3. A smaller 4 mm area of mild nodularity is present on axial image 18 series 6. No ground-glass opacities. No mediastinal or hilar adenopathy. No pleural effusions. Axillary and supraclavicular regions appear unremarkable.  CT ABDOMEN AND PELVIS FINDINGS  There is moderate dilatation of small and large bowel, predominantly fluid-filled. There is no bowel obstruction. There is no extraluminal air. There is no focal inflammatory change.  There are normal appearances of the liver, spleen, pancreas, adrenals and kidneys. There is no adenopathy in the abdomen or pelvis. There is no ascites.  No significant musculoskeletal abnormality is evident.  IMPRESSION: 1. Mild nodularity in the right upper lobe lung, indeterminate but more likely benign. Depending on clinical setting, conservative follow-up will probably be reasonable. No confluent airspace consolidation. No ground-glass opacities. No effusions or adenopathy. 2. There is diffuse dilatation of small and large bowel with a large volume of intraluminal fluid. This is consistent with the clinically described history of a diarrheal illness. There is no bowel obstruction. There is no perforation.   Electronically Signed   By: Ellery Plunk M.D.    On: 05/05/2015 02:10   Ct Abdomen Pelvis W Contrast  05/05/2015   CLINICAL DATA:  Midsternal and right-sided chest pain. Nausea vomiting and diarrhea.  EXAM: CT CHEST, ABDOMEN, AND PELVIS WITH CONTRAST  TECHNIQUE: Multidetector CT imaging of the chest, abdomen and pelvis was performed following the standard protocol during bolus administration of intravenous contrast.  CONTRAST:  50mL OMNIPAQUE IOHEXOL 300 MG/ML SOLN, OMNIPAQUE IOHEXOL 300 MG/ML SOLN  COMPARISON:  None.  FINDINGS: CT CHEST FINDINGS  There is mild nodularity in the right upper lobe, with appearances most suggestive of nodular scarring. The most prominent measures approximately 7 mm in the right upper lobe laterally, axial image 24 series 6 and coronal image 70 series 3. A smaller 4 mm area of mild nodularity is present on axial image 18 series 6. No ground-glass opacities. No mediastinal or hilar adenopathy. No pleural effusions. Axillary and supraclavicular regions appear unremarkable.  CT ABDOMEN AND PELVIS FINDINGS  There is moderate dilatation  of small and large bowel, predominantly fluid-filled. There is no bowel obstruction. There is no extraluminal air. There is no focal inflammatory change.  There are normal appearances of the liver, spleen, pancreas, adrenals and kidneys. There is no adenopathy in the abdomen or pelvis. There is no ascites.  No significant musculoskeletal abnormality is evident.  IMPRESSION: 1. Mild nodularity in the right upper lobe lung, indeterminate but more likely benign. Depending on clinical setting, conservative follow-up will probably be reasonable. No confluent airspace consolidation. No ground-glass opacities. No effusions or adenopathy. 2. There is diffuse dilatation of small and large bowel with a large volume of intraluminal fluid. This is consistent with the clinically described history of a diarrheal illness. There is no bowel obstruction. There is no perforation.   Electronically Signed   By: Ellery Plunk M.D.   On: 05/05/2015 02:10   Dg Abd Acute W/chest  05/04/2015   CLINICAL DATA:  Mid chest pain, generalized abdominal pain, vomiting. Shortness wreck, productive cough.  EXAM: DG ABDOMEN ACUTE W/ 1V CHEST  COMPARISON:  04/12/2015  FINDINGS: Nodular density projects over the right upper lobe, stable since prior study. Heart is normal size. Left lung is clear. No effusions.  Mild diffuse gaseous distention of bowel, similar or slightly worsened since prior study. Scattered air-fluid levels. No free air. No organomegaly or suspicious calcification.  IMPRESSION: Continued diffuse gaseous distention of bowel, slightly progressed since prior study. Favor ileus or enteritis. No free air.  Stable nodular density over the right upper lobe. Recommend further evaluation with chest CT.   Electronically Signed   By: Charlett Nose M.D.   On: 05/04/2015 22:51    Scheduled Meds: . amLODipine  5 mg Oral Daily  . darunavir-cobicistat  1 tablet Oral Daily  . dolutegravir  50 mg Oral Daily  . enoxaparin (LOVENOX) injection  40 mg Subcutaneous Q24H  . feeding supplement (ENSURE ENLIVE)  237 mL Oral TID BM  . magnesium oxide  400 mg Oral TID  . rilpivirine  25 mg Oral Q breakfast   Continuous Infusions: . sodium chloride 100 mL/hr at 05/06/15 0138    Active Problems:   HIV (human immunodeficiency virus infection)   Hypokalemia   Nausea & vomiting   Hypomagnesemia   AIDS   Protein-calorie malnutrition, severe   Chest pain    Time spent: 25 minutes. Greater than 50% of this time was spent in direct contact with the patient coordinating care.    Chaya Jan  [facility] Pager [phone]  If 7PM-7AM, please contact night-coverage at [email or web address], password [email or web address] 05/06/2015, 10:33 AM  LOS: 2 days

## 2015-05-06 NOTE — Progress Notes (Signed)
Contacted Dr. Jacky Kindle for second level review for readmission.  Per Dr. Jacky Kindle pt. Class observation not readmission.

## 2015-05-06 NOTE — Progress Notes (Signed)
Notified Dr. Ardyth Harps via text that patient refuses MG po he sates he makes him sick.  He stated he threw up his breakfast.

## 2015-05-06 NOTE — Progress Notes (Signed)
Utilization review Completed Kollins Fenter RN BSN   

## 2015-05-07 LAB — T-HELPER CELLS (CD4) COUNT (NOT AT ARMC): CD4 % Helper T Cell: 1 % — ABNORMAL LOW (ref 33–55)

## 2015-05-07 LAB — URINE CULTURE: CULTURE: NO GROWTH

## 2015-05-07 MED ORDER — MAGNESIUM OXIDE 400 (241.3 MG) MG PO TABS: 400.0000 mg | ORAL_TABLET | Freq: Three times a day (TID) | ORAL | Status: AC

## 2015-05-07 NOTE — Care Management Note (Signed)
Case Management Note  Patient Details  Name: Kenneth Duran MRN: [id number] Date of Birth: 28-Sep-1961  Expected Discharge Date:    05/07/2015              Expected Discharge Plan:  Home/Self Care  In-House Referral:  NA  Discharge planning Services  CM Consult  Post Acute Care Choice:  NA Choice offered to:  NA  DME Arranged:    DME Agency:     HH Arranged:    HH Agency:     Status of Service:  Completed, signed off  Medicare Important Message Given:    Date Medicare IM Given:    Medicare IM give by:    Date Additional Medicare IM Given:    Additional Medicare Important Message give by:     If discussed at Long Length of Stay Meetings, dates discussed:    Additional Comments: Pt admitted with hypokalemia. Pt is from home with family. Pt independent with ADL's. Pt plans to discharge today, home with self care. No CM needs.  Malcolm Metro, RN 05/07/2015, 11:45 AM

## 2015-05-07 NOTE — Progress Notes (Signed)
Patient discharged with instructions, prescription, and care notes.  Verbalized understanding via teach back.  IV was removed and the site was WNL. Patient voiced no further complaints or concerns at the time of discharge.  Appointments scheduled per instructions.  Patient left the floor via w/c with staff and family in stable condition.  Patient took his medications home that he had left with [place].  [facility] was notified of patients discharge.

## 2015-05-07 NOTE — Discharge Summary (Signed)
Physician Discharge Summary  Kenneth Duran ZOX:[id number] DOB: 1961/06/18 DOA: 05/04/2015  PCP: Verneita Griffes, NP  Admit date: 05/04/2015 Discharge date: 05/07/2015  Time spent: 45 minutes  Recommendations for Outpatient Follow-up:  -Will be discharged home today. -Advised to follow up with his HIV physician in [place] in 2 weeks.   Discharge Diagnoses:  Active Problems:   HIV (human immunodeficiency virus infection)   Hypokalemia   Nausea & vomiting   Hypomagnesemia   AIDS   Protein-calorie malnutrition, severe   Chest pain   Discharge Condition: Stable and improved  Filed Weights   05/04/15 2129 05/05/15 0241  Weight: 52.164 kg (115 lb) 53.8 kg (118 lb 9.7 oz)    History of present illness:  54 year old male who  has a past medical history of HIV (human immunodeficiency virus infection) and Hypertension. Today comes in the hospital with complaint of chest pain. Patient says the chest pain started tonight and also has been having generalized weakness mild cough and headache. Patient also had vomiting and abdominal pain. Denies dysuria urgency frequency of urination. Patient has history of HIV and has missed couple of days of his medications. Patient denies any history of CAD In the ED patient found to have hypokalemia Patient also had diarrhea for past 3 days.  Hospital Course:   Nausea/vomiting/diarrhea -Only 2 episodes of diarrhea in past 24 hours. -C Diff PCR negative, stool pathogen panel pending at time of DC. -See no need for antibiotics. -Patient is anxious to be discharged home today.  Hypokalemia/hypomagnesemia -Secondary to GI losses. -Repleted.  HIV -CD4 count and viral load pending at time of DC. -Continue anti-retroviral therapy. -Advised to secure follow up with his ID physician.  Chest pain -Resolved. -Suspect GI in origin related to acute GI illness. -Has ruled out for acute coronary syndrome, no plans for further cardiac workup  at present.  Severe protein caloric malnutrition -Related to HIV. -Dietary supplements.  Procedures:  None   Consultations:  None  Discharge Instructions  Discharge Instructions    Increase activity slowly    Complete by:  As directed             Medication List    STOP taking these medications        ciprofloxacin 500 MG tablet  Commonly known as:  CIPRO     metroNIDAZOLE 500 MG tablet  Commonly known as:  FLAGYL      TAKE these medications        amLODipine 5 MG tablet  Commonly known as:  NORVASC  Take 5 mg by mouth daily.     feeding supplement Liqd  Take 1 Container by mouth 2 (two) times daily between meals.     magnesium oxide 400 (241.3 MG) MG tablet  Commonly known as:  MAG-OX  Take 1 tablet (400 mg total) by mouth 3 (three) times daily.     PREZCOBIX 800-150 MG per tablet  Generic drug:  darunavir-cobicistat  Take 1 tablet by mouth daily.     rilpivirine 25 MG Tabs tablet  Commonly known as:  EDURANT  Take 25 mg by mouth daily with breakfast.     TIVICAY 50 MG tablet  Generic drug:  dolutegravir  Take 50 mg by mouth daily.       No Known Allergies     Follow-up Information    Follow up with Verneita Griffes, NP. Schedule an appointment as soon as possible for a visit on 05/17/2015.   Specialty:  Nurse Practitioner   Why:  A REMINDER OF YOUR FOLLOW-UP APPT ON AUG 11,2016 AT [facility]   Contact information:   [facility] [address] [place] [address] [phone]        The results of significant diagnostics from this hospitalization (including imaging, microbiology, ancillary and laboratory) are listed below for reference.    Significant Diagnostic Studies: Ct Chest W Contrast  05/05/2015   CLINICAL DATA:  Midsternal and right-sided chest pain. Nausea vomiting and diarrhea.  EXAM: CT CHEST, ABDOMEN, AND PELVIS WITH CONTRAST  TECHNIQUE: Multidetector CT imaging of the chest, abdomen and pelvis was performed following the  standard protocol during bolus administration of intravenous contrast.  CONTRAST:  50mL OMNIPAQUE IOHEXOL 300 MG/ML SOLN, OMNIPAQUE IOHEXOL 300 MG/ML SOLN  COMPARISON:  None.  FINDINGS: CT CHEST FINDINGS  There is mild nodularity in the right upper lobe, with appearances most suggestive of nodular scarring. The most prominent measures approximately 7 mm in the right upper lobe laterally, axial image 24 series 6 and coronal image 70 series 3. A smaller 4 mm area of mild nodularity is present on axial image 18 series 6. No ground-glass opacities. No mediastinal or hilar adenopathy. No pleural effusions. Axillary and supraclavicular regions appear unremarkable.  CT ABDOMEN AND PELVIS FINDINGS  There is moderate dilatation of small and large bowel, predominantly fluid-filled. There is no bowel obstruction. There is no extraluminal air. There is no focal inflammatory change.  There are normal appearances of the liver, spleen, pancreas, adrenals and kidneys. There is no adenopathy in the abdomen or pelvis. There is no ascites.  No significant musculoskeletal abnormality is evident.  IMPRESSION: 1. Mild nodularity in the right upper lobe lung, indeterminate but more likely benign. Depending on clinical setting, conservative follow-up will probably be reasonable. No confluent airspace consolidation. No ground-glass opacities. No effusions or adenopathy. 2. There is diffuse dilatation of small and large bowel with a large volume of intraluminal fluid. This is consistent with the clinically described history of a diarrheal illness. There is no bowel obstruction. There is no perforation.   Electronically Signed   By: Ellery Plunk M.D.   On: 05/05/2015 02:10   Ct Abdomen Pelvis W Contrast  05/05/2015   CLINICAL DATA:  Midsternal and right-sided chest pain. Nausea vomiting and diarrhea.  EXAM: CT CHEST, ABDOMEN, AND PELVIS WITH CONTRAST  TECHNIQUE: Multidetector CT imaging of the chest, abdomen and pelvis was  performed following the standard protocol during bolus administration of intravenous contrast.  CONTRAST:  50mL OMNIPAQUE IOHEXOL 300 MG/ML SOLN, OMNIPAQUE IOHEXOL 300 MG/ML SOLN  COMPARISON:  None.  FINDINGS: CT CHEST FINDINGS  There is mild nodularity in the right upper lobe, with appearances most suggestive of nodular scarring. The most prominent measures approximately 7 mm in the right upper lobe laterally, axial image 24 series 6 and coronal image 70 series 3. A smaller 4 mm area of mild nodularity is present on axial image 18 series 6. No ground-glass opacities. No mediastinal or hilar adenopathy. No pleural effusions. Axillary and supraclavicular regions appear unremarkable.  CT ABDOMEN AND PELVIS FINDINGS  There is moderate dilatation of small and large bowel, predominantly fluid-filled. There is no bowel obstruction. There is no extraluminal air. There is no focal inflammatory change.  There are normal appearances of the liver, spleen, pancreas, adrenals and kidneys. There is no adenopathy in the abdomen or pelvis. There is no ascites.  No significant musculoskeletal abnormality is evident.  IMPRESSION: 1. Mild nodularity in the  right upper lobe lung, indeterminate but more likely benign. Depending on clinical setting, conservative follow-up will probably be reasonable. No confluent airspace consolidation. No ground-glass opacities. No effusions or adenopathy. 2. There is diffuse dilatation of small and large bowel with a large volume of intraluminal fluid. This is consistent with the clinically described history of a diarrheal illness. There is no bowel obstruction. There is no perforation.   Electronically Signed   By: Ellery Plunk M.D.   On: 05/05/2015 02:10   Dg Abd Acute W/chest  05/04/2015   CLINICAL DATA:  Mid chest pain, generalized abdominal pain, vomiting. Shortness wreck, productive cough.  EXAM: DG ABDOMEN ACUTE W/ 1V CHEST  COMPARISON:  04/12/2015  FINDINGS: Nodular density  projects over the right upper lobe, stable since prior study. Heart is normal size. Left lung is clear. No effusions.  Mild diffuse gaseous distention of bowel, similar or slightly worsened since prior study. Scattered air-fluid levels. No free air. No organomegaly or suspicious calcification.  IMPRESSION: Continued diffuse gaseous distention of bowel, slightly progressed since prior study. Favor ileus or enteritis. No free air.  Stable nodular density over the right upper lobe. Recommend further evaluation with chest CT.   Electronically Signed   By: Charlett Nose M.D.   On: 05/04/2015 22:51   Dg Abd Acute W/chest  04/12/2015   CLINICAL DATA:  Four day history of vomiting. History of HIV disease  EXAM: DG ABDOMEN ACUTE W/ 1V CHEST  COMPARISON:  None.  FINDINGS: PA chest: There is no edema or consolidation. There is a 7 mm opacity in the right upper lobe region which is ill-defined and may represent mild scarring. Heart size and pulmonary vascularity are normal. No adenopathy.  Supine and upright abdomen: There is no appreciable bowel dilatation. However, there are multiple air-fluid levels noted. No free air. There are no abnormal calcifications.  IMPRESSION: Scattered air-fluid levels without appreciable bowel dilatation. Suspect early ileus or enteritis as most likely. Obstruction felt to be less likely. No free air.  No lung edema or consolidation. 7 mm ill-defined opacity right upper lobe of uncertain etiology. Advise followup chest radiograph in 6 to 8 weeks to further evaluate. If this opacity persists, noncontrast enhanced chest CT may be advisable to further assess.   Electronically Signed   By: Bretta Bang III M.D.   On: 04/12/2015 17:48    Microbiology: Recent Results (from the past 240 hour(s))  Blood culture (routine x 2)     Status: None (Preliminary result)   Collection Time: 05/04/15 10:13 PM  Result Value Ref Range Status   Specimen Description BLOOD RIGHT ARM  Final   Special  Requests BOTTLES DRAWN AEROBIC AND ANAEROBIC 8 CC EACH  Final   Culture NO GROWTH 3 DAYS  Final   Report Status PENDING  Incomplete  Blood culture (routine x 2)     Status: None (Preliminary result)   Collection Time: 05/04/15 10:52 PM  Result Value Ref Range Status   Specimen Description BLOOD RIGHT ARM  Final   Special Requests BOTTLES DRAWN AEROBIC AND ANAEROBIC 10 CC EACH  Final   Culture NO GROWTH 3 DAYS  Final   Report Status PENDING  Incomplete  Urine culture     Status: None   Collection Time: 05/04/15 11:16 PM  Result Value Ref Range Status   Specimen Description URINE, CLEAN CATCH  Final   Special Requests NONE  Final   Culture   Final    NO GROWTH 2 DAYS  Performed at [facility]    Report Status 05/07/2015 FINAL  Final  Clostridium Difficile by PCR (not at [facility])     Status: None   Collection Time: 05/06/15  2:44 PM  Result Value Ref Range Status   C difficile by pcr NEGATIVE NEGATIVE Final     Labs: Basic Metabolic Panel:  Recent Labs Lab 05/04/15 2213 05/05/15 0831 05/06/15 0630  NA 138 135 134*  K 2.5* 3.2* 3.6  CL 110 111 111  CO2 23 18* 20*  GLUCOSE 86 73 77  BUN 17 14 11   CREATININE 1.20 1.09 1.00  CALCIUM 8.3* 8.3* 8.4*  MG 1.3*  --  1.7   Liver Function Tests:  Recent Labs Lab 05/04/15 2213 05/05/15 0831  AST 32 57*  ALT 53 63  ALKPHOS 81 78  BILITOT 0.4 0.3  PROT 7.4 6.9  ALBUMIN 3.4* 3.1*   No results for input(s): LIPASE, AMYLASE in the last 168 hours. No results for input(s): AMMONIA in the last 168 hours. CBC:  Recent Labs Lab 05/04/15 2213 05/05/15 0831  WBC 3.7* 4.4  NEUTROABS 2.4  --   HGB 12.0* 11.7*  HCT 35.4* 34.9*  MCV 96.2 96.4  PLT 243 225   Cardiac Enzymes:  Recent Labs Lab 05/04/15 2213 05/05/15 0831 05/05/15 1443  TROPONINI <0.03 <0.03 <0.03   BNP: BNP (last 3 results) No results for input(s): BNP in the last 8760 hours.  ProBNP (last 3 results) No results for input(s): PROBNP in the last  8760 hours.  CBG: No results for input(s): GLUCAP in the last 168 hours.     SignedChaya Jan  [facility] Pager: [phone] 05/07/2015, 2:13 PM

## 2015-05-08 LAB — GI PATHOGEN PANEL BY PCR, STOOL
C difficile toxin A/B: NOT DETECTED
CAMPYLOBACTER BY PCR: NOT DETECTED
Cryptosporidium by PCR: NOT DETECTED
E COLI (ETEC) LT/ST: NOT DETECTED
E COLI (STEC): NOT DETECTED
E coli 0157 by PCR: NOT DETECTED
G lamblia by PCR: NOT DETECTED
Rotavirus A by PCR: NOT DETECTED
SALMONELLA BY PCR: NOT DETECTED
SHIGELLA BY PCR: NOT DETECTED

## 2015-05-09 LAB — CULTURE, BLOOD (ROUTINE X 2)
Culture: NO GROWTH
Culture: NO GROWTH

## 2015-05-09 LAB — HIV-1 RNA QUANT-NO REFLEX-BLD
HIV 1 RNA Quant: 20 copies/mL
LOG10 HIV-1 RNA: 1.301 log10copy/mL

## 2015-07-07 DEATH — deceased

## 2016-05-01 IMAGING — DX DG ABDOMEN ACUTE W/ 1V CHEST
4 series · 4 of 4 positions shown · non-contrast
Comparison: None.

CLINICAL DATA: Four day history of vomiting. History of HIV disease

EXAM:
DG ABDOMEN ACUTE W/ 1V CHEST

[chest pa]
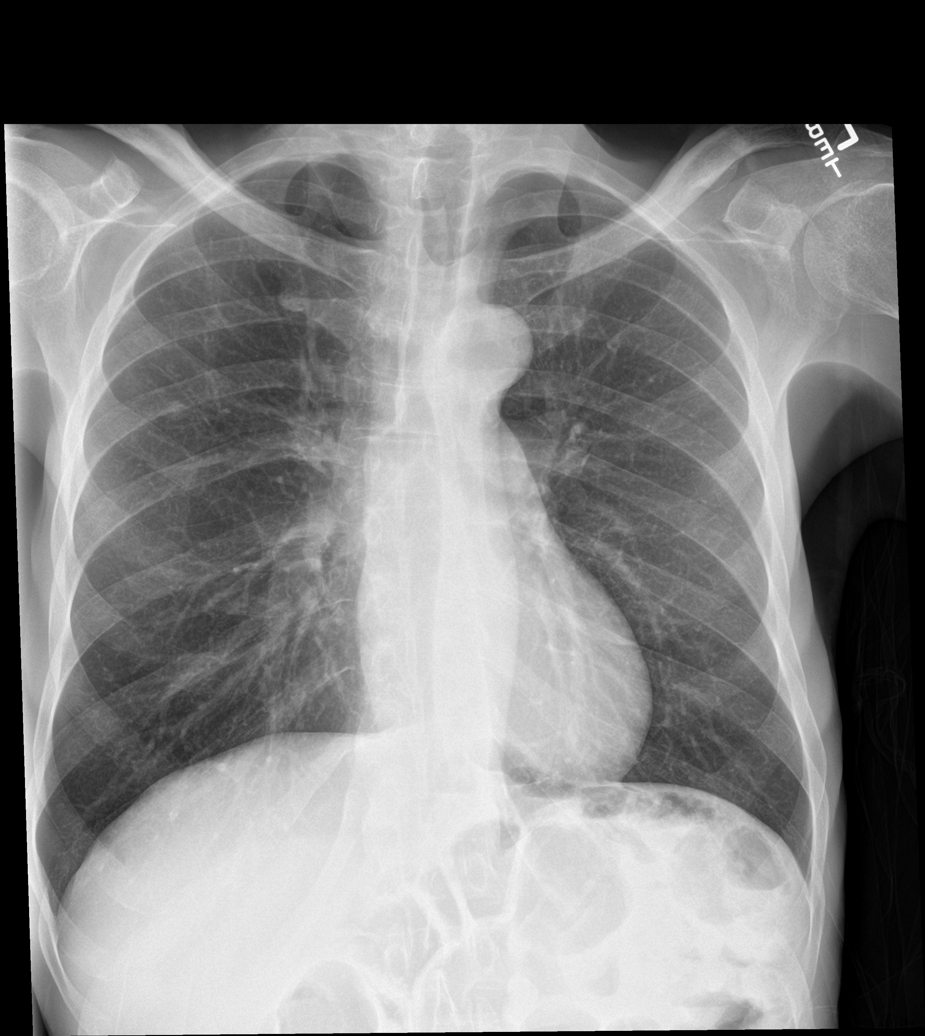

[abdomen erect]
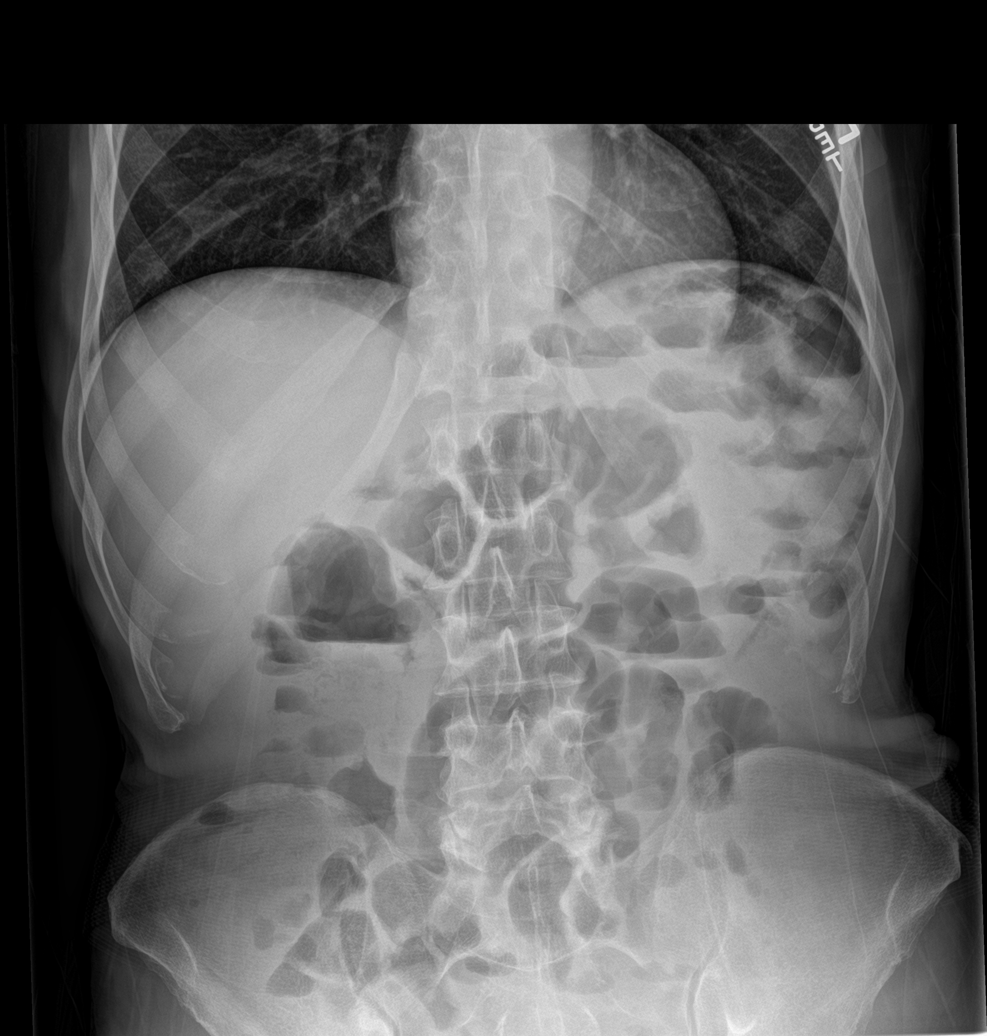

[abdomen supine (1 of 2)]
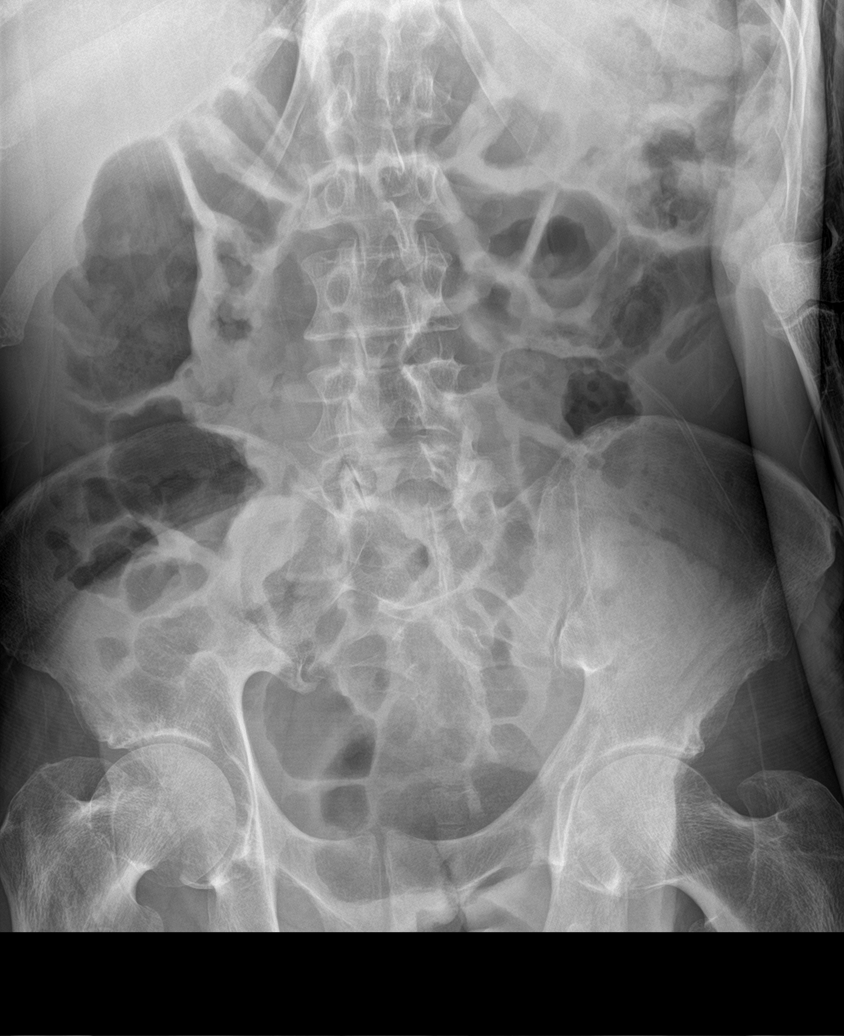

[abdomen supine (2 of 2)]
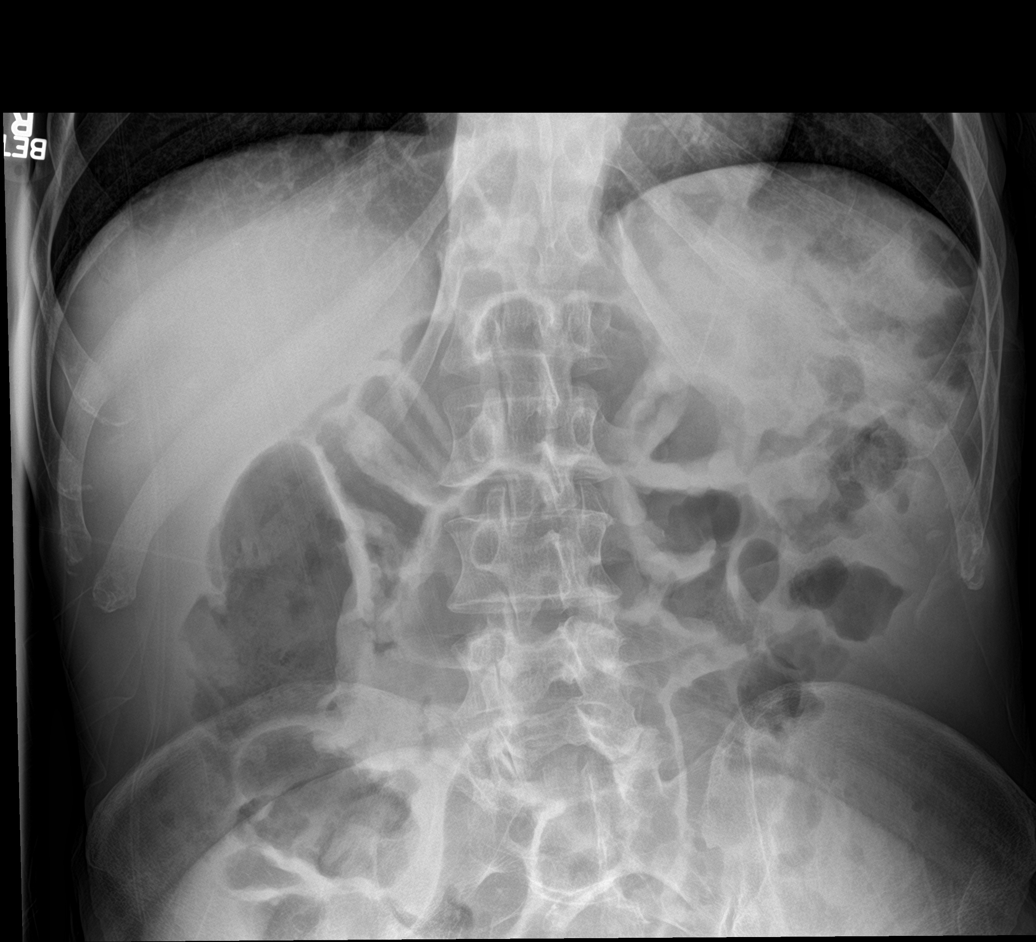

[4 of 4 positions shown; findings below may reference images not displayed]

FINDINGS: PA chest: There is no edema or consolidation. There is a 7 mm
opacity in the right upper lobe region which is ill-defined and may
represent mild scarring. Heart size and pulmonary vascularity are
normal. No adenopathy.

Supine and upright abdomen: There is no appreciable bowel
dilatation. However, there are multiple air-fluid levels noted. No
free air. There are no abnormal calcifications.
IMPRESSION: Scattered air-fluid levels without appreciable bowel dilatation.
Suspect early ileus or enteritis as most likely. Obstruction felt to
be less likely. No free air.

No lung edema or consolidation. 7 mm ill-defined opacity right upper
lobe of uncertain etiology. Advise followup chest radiograph in 6 to
8 weeks to further evaluate. If this opacity persists, noncontrast
enhanced chest CT may be advisable to further assess.
